# Patient Record
Sex: Male | Born: 1955 | Hispanic: No | Marital: Married | Smoking: Former smoker
Health system: Southern US, Community
[De-identification: ages and names within clinical notes are randomized; demographics above are authoritative.]

## PROBLEM LIST (undated history)

## (undated) DIAGNOSIS — E119 Type 2 diabetes mellitus without complications: Secondary | ICD-10-CM

---

## 1998-11-22 ENCOUNTER — Encounter: Payer: Self-pay | Admitting: Emergency Medicine

## 1998-11-22 ENCOUNTER — Emergency Department (HOSPITAL_COMMUNITY): Admission: EM | Admit: 1998-11-22 | Discharge: 1998-11-22 | Payer: Self-pay | Admitting: Emergency Medicine

## 2001-05-24 ENCOUNTER — Emergency Department (HOSPITAL_COMMUNITY): Admission: EM | Admit: 2001-05-24 | Discharge: 2001-05-24 | Payer: Self-pay | Admitting: Emergency Medicine

## 2001-05-24 ENCOUNTER — Encounter: Payer: Self-pay | Admitting: Emergency Medicine

## 2001-05-28 ENCOUNTER — Ambulatory Visit (HOSPITAL_COMMUNITY): Admission: RE | Admit: 2001-05-28 | Discharge: 2001-05-28 | Payer: Self-pay | Admitting: Urology

## 2001-05-28 ENCOUNTER — Encounter: Payer: Self-pay | Admitting: Urology

## 2004-01-27 ENCOUNTER — Emergency Department (HOSPITAL_COMMUNITY): Admission: EM | Admit: 2004-01-27 | Discharge: 2004-01-27 | Payer: Self-pay | Admitting: Emergency Medicine

## 2004-07-07 IMAGING — CR DG ABDOMEN ACUTE W/ 1V CHEST
3 series · 3 of 3 positions shown · non-contrast
Comparison: none

CLINICAL DATA: Lt abdominal pain.
 ABDOMEN ACUTE WITH PA CHEST
 Comparison chest radiograph 05/24/01.  
 Normal heart size, mediastinal contours, and vascularity.  Lungs clear. 
 Left renal calculi including tiny calculus 2-3 mm diameter at lower pole left kidney and a calculus at the left renal pelvis measuring 11 x 7 mm diameter.  An additional calcification is noted in the left pelvis, angular, 6 x 5 mm in diameter, suspicious for distal ureteral calculus.  No right side calculi identified.  Bowel gas pattern unremarkable with stool noted in the proximal half of the right colon.  No bony lesions.  No free intraperitoneal air.
 IMPRESSION
 No acute pulmonary abnormalities.
 Left renal calculi with calculi noted at the left renal pelvis and distal left ureter.

[view not recorded (1 of 3)]
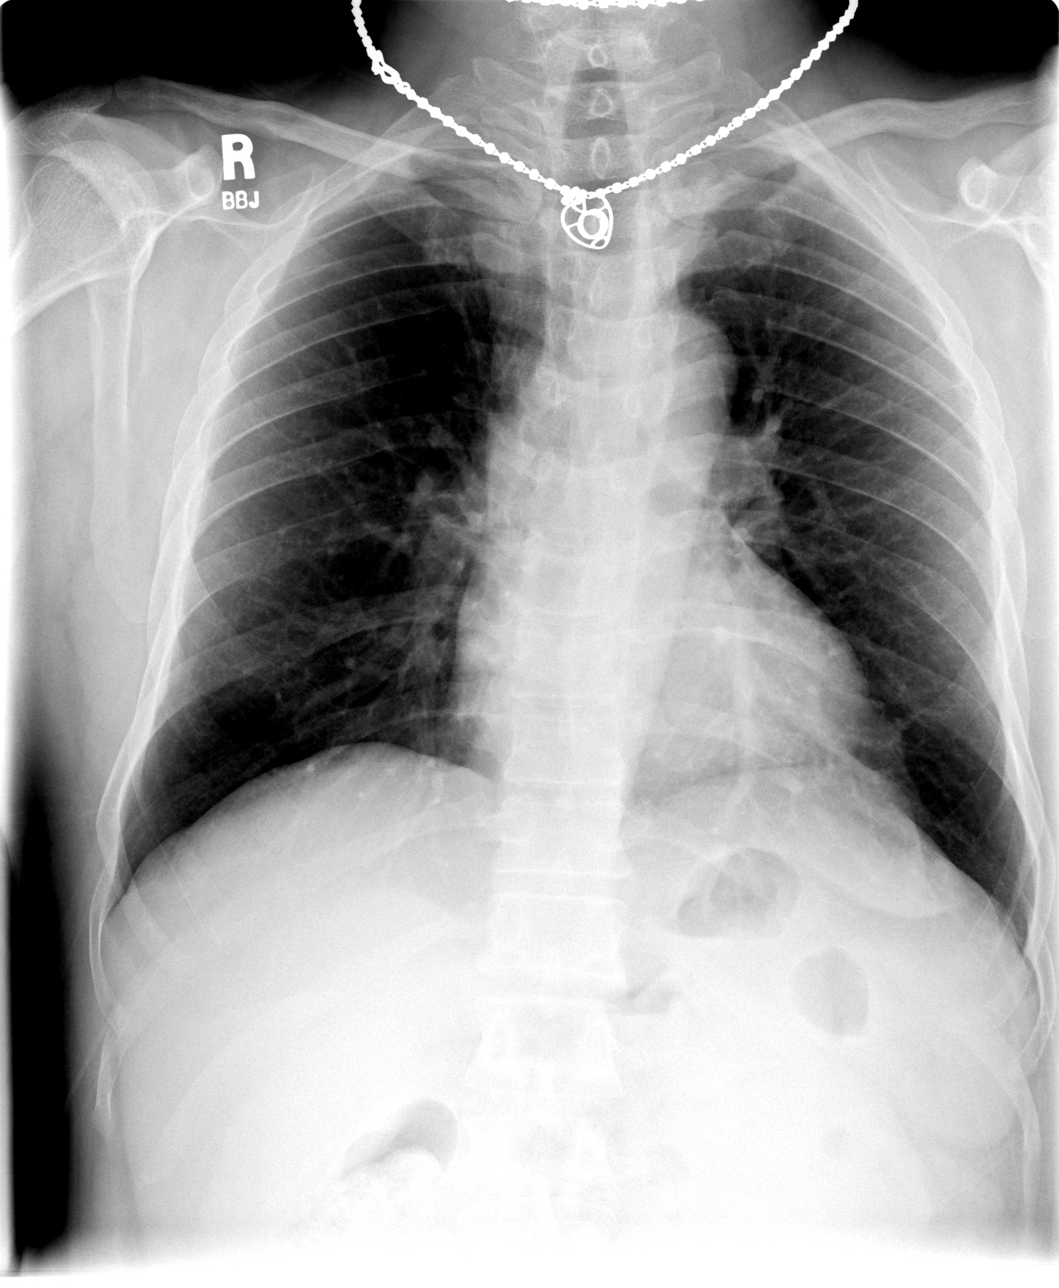

[view not recorded (2 of 3)]
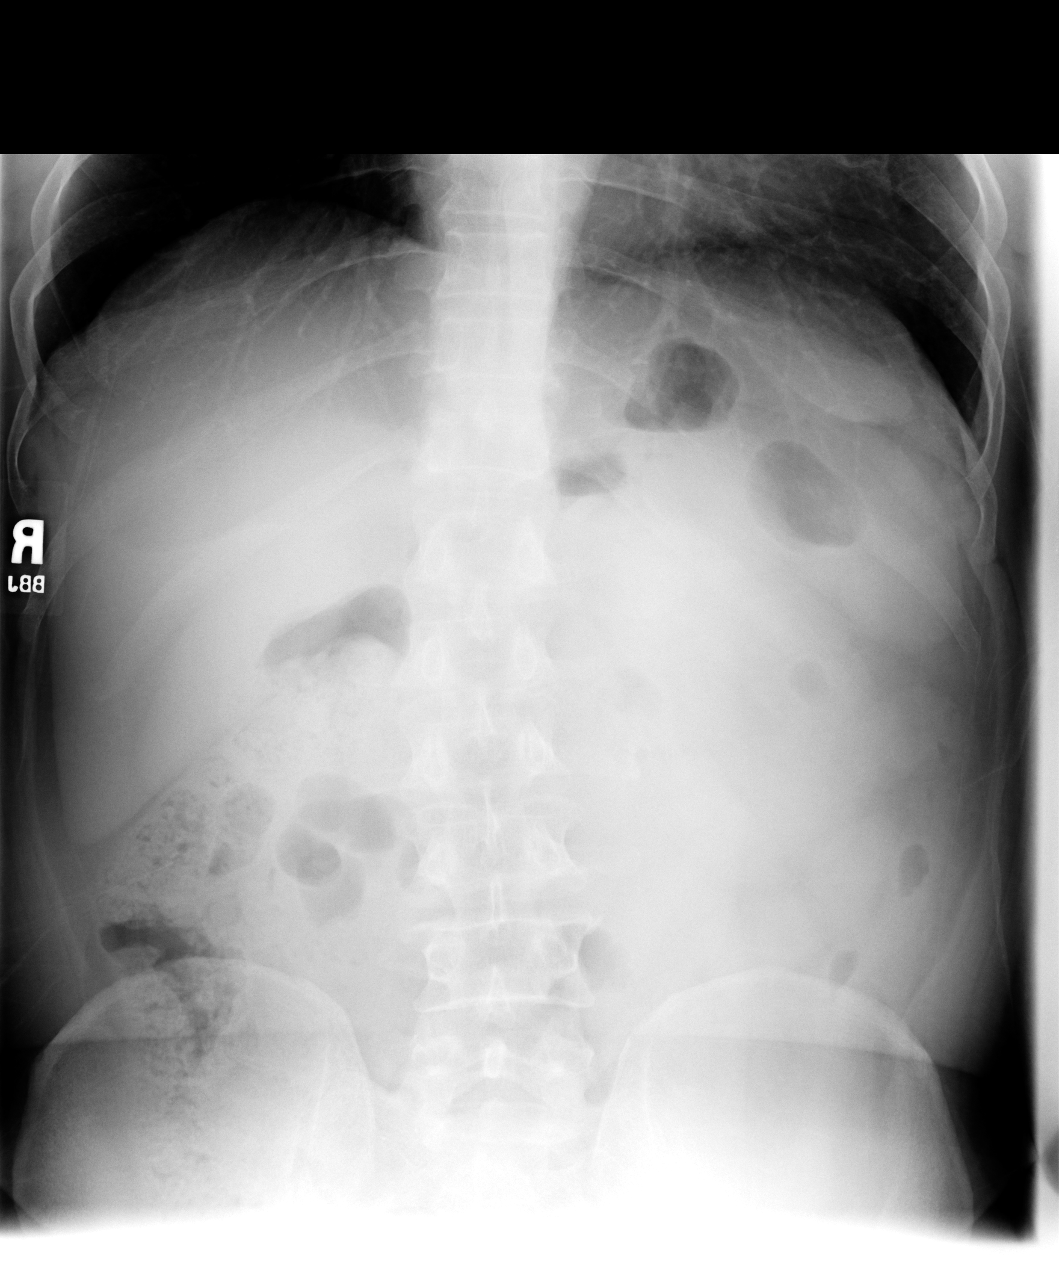

[view not recorded (3 of 3)]
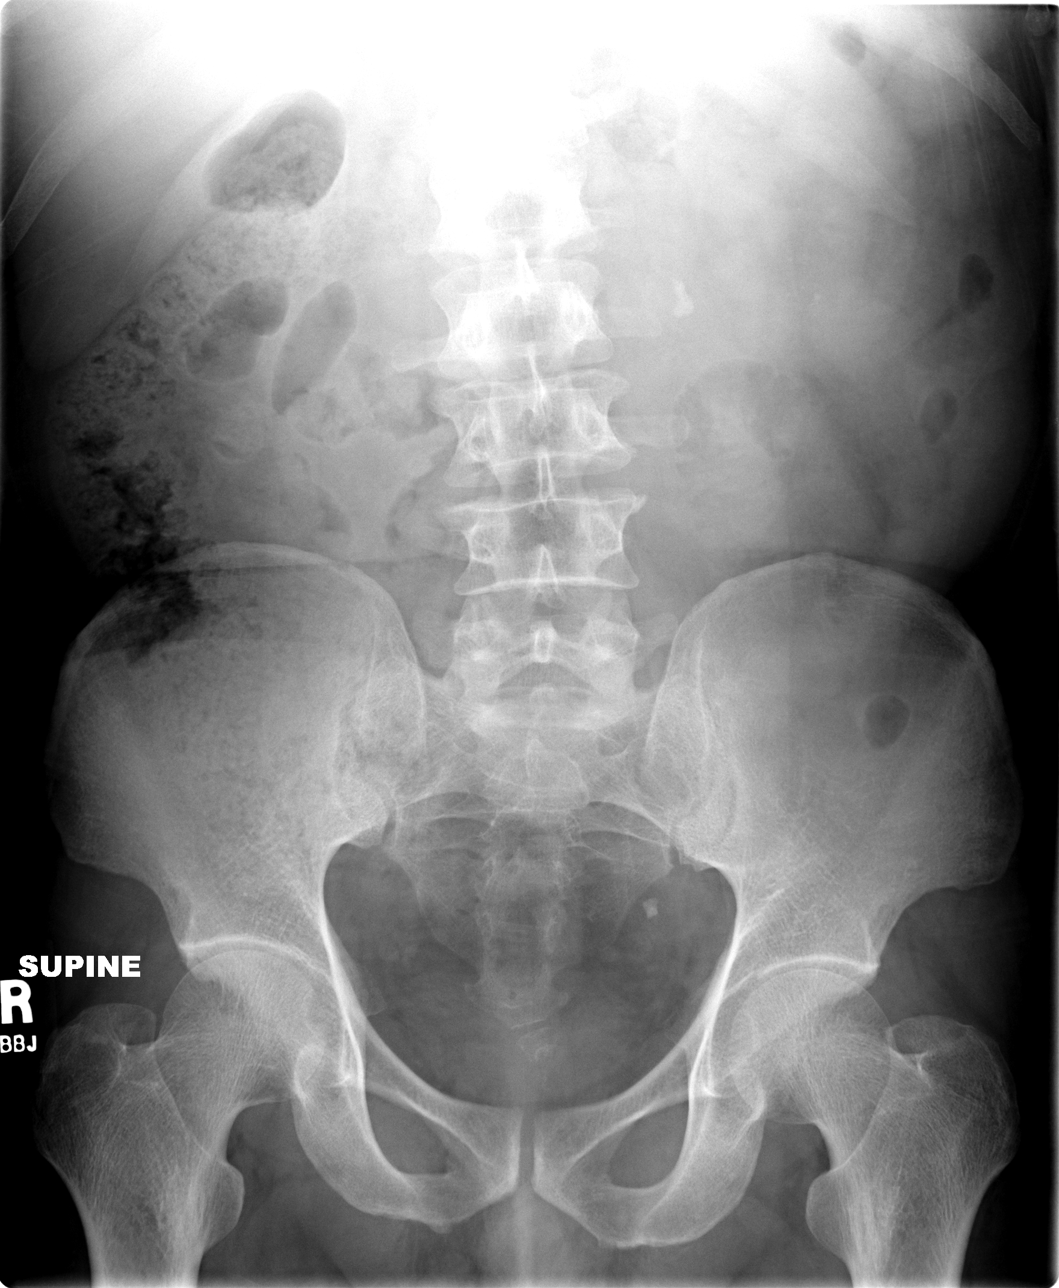

[3 of 3 positions shown; findings below may reference images not displayed]

## 2014-03-31 ENCOUNTER — Other Ambulatory Visit (HOSPITAL_COMMUNITY): Payer: Self-pay | Admitting: Urology

## 2014-03-31 DIAGNOSIS — N509 Disorder of male genital organs, unspecified: Secondary | ICD-10-CM

## 2014-04-06 ENCOUNTER — Ambulatory Visit (HOSPITAL_COMMUNITY): Payer: Self-pay | Attending: Urology

## 2015-04-30 DIAGNOSIS — F17209 Nicotine dependence, unspecified, with unspecified nicotine-induced disorders: Secondary | ICD-10-CM | POA: Insufficient documentation

## 2015-05-13 DIAGNOSIS — N4400 Torsion of testis, unspecified: Secondary | ICD-10-CM | POA: Insufficient documentation

## 2015-05-13 DIAGNOSIS — Z9079 Acquired absence of other genital organ(s): Secondary | ICD-10-CM | POA: Insufficient documentation

## 2020-03-22 DIAGNOSIS — L409 Psoriasis, unspecified: Secondary | ICD-10-CM | POA: Diagnosis not present

## 2020-03-22 DIAGNOSIS — M1990 Unspecified osteoarthritis, unspecified site: Secondary | ICD-10-CM | POA: Diagnosis not present

## 2020-03-22 DIAGNOSIS — Z6824 Body mass index (BMI) 24.0-24.9, adult: Secondary | ICD-10-CM | POA: Diagnosis not present

## 2020-03-22 DIAGNOSIS — E7800 Pure hypercholesterolemia, unspecified: Secondary | ICD-10-CM | POA: Diagnosis not present

## 2020-03-22 DIAGNOSIS — Z299 Encounter for prophylactic measures, unspecified: Secondary | ICD-10-CM | POA: Diagnosis not present

## 2020-03-22 DIAGNOSIS — Z87891 Personal history of nicotine dependence: Secondary | ICD-10-CM | POA: Diagnosis not present

## 2020-08-02 DIAGNOSIS — L400 Psoriasis vulgaris: Secondary | ICD-10-CM | POA: Diagnosis not present

## 2020-08-02 DIAGNOSIS — Z79899 Other long term (current) drug therapy: Secondary | ICD-10-CM | POA: Diagnosis not present

## 2020-08-09 DIAGNOSIS — R5383 Other fatigue: Secondary | ICD-10-CM | POA: Diagnosis not present

## 2020-08-09 DIAGNOSIS — E7800 Pure hypercholesterolemia, unspecified: Secondary | ICD-10-CM | POA: Diagnosis not present

## 2020-08-09 DIAGNOSIS — Z0000 Encounter for general adult medical examination without abnormal findings: Secondary | ICD-10-CM | POA: Diagnosis not present

## 2020-08-09 DIAGNOSIS — Z7189 Other specified counseling: Secondary | ICD-10-CM | POA: Diagnosis not present

## 2020-08-09 DIAGNOSIS — Z299 Encounter for prophylactic measures, unspecified: Secondary | ICD-10-CM | POA: Diagnosis not present

## 2020-08-09 DIAGNOSIS — Z6823 Body mass index (BMI) 23.0-23.9, adult: Secondary | ICD-10-CM | POA: Diagnosis not present

## 2020-09-27 DIAGNOSIS — Z6823 Body mass index (BMI) 23.0-23.9, adult: Secondary | ICD-10-CM | POA: Diagnosis not present

## 2020-09-27 DIAGNOSIS — Z418 Encounter for other procedures for purposes other than remedying health state: Secondary | ICD-10-CM | POA: Diagnosis not present

## 2020-09-27 DIAGNOSIS — E1165 Type 2 diabetes mellitus with hyperglycemia: Secondary | ICD-10-CM | POA: Diagnosis not present

## 2020-09-27 DIAGNOSIS — M1990 Unspecified osteoarthritis, unspecified site: Secondary | ICD-10-CM | POA: Diagnosis not present

## 2020-09-27 DIAGNOSIS — E7800 Pure hypercholesterolemia, unspecified: Secondary | ICD-10-CM | POA: Diagnosis not present

## 2020-09-27 DIAGNOSIS — Z23 Encounter for immunization: Secondary | ICD-10-CM | POA: Diagnosis not present

## 2020-10-10 DIAGNOSIS — M1990 Unspecified osteoarthritis, unspecified site: Secondary | ICD-10-CM | POA: Diagnosis not present

## 2020-10-10 DIAGNOSIS — E1165 Type 2 diabetes mellitus with hyperglycemia: Secondary | ICD-10-CM | POA: Diagnosis not present

## 2020-11-29 ENCOUNTER — Inpatient Hospital Stay (HOSPITAL_COMMUNITY)
Admission: AD | Admit: 2020-11-29 | Discharge: 2020-12-01 | DRG: 247 | Disposition: A | Discharge status: Home / Self Care | Payer: Medicare Other | Attending: Interventional Cardiology | Admitting: Interventional Cardiology

## 2020-11-29 ENCOUNTER — Other Ambulatory Visit: Payer: Self-pay

## 2020-11-29 ENCOUNTER — Encounter (HOSPITAL_COMMUNITY): Payer: Self-pay | Admitting: Interventional Cardiology

## 2020-11-29 ENCOUNTER — Encounter (HOSPITAL_COMMUNITY)
Admission: AD | Disposition: A | Discharge status: Home / Self Care | Payer: Self-pay | Source: Home / Self Care | Attending: Interventional Cardiology

## 2020-11-29 DIAGNOSIS — Z743 Need for continuous supervision: Secondary | ICD-10-CM | POA: Diagnosis not present

## 2020-11-29 DIAGNOSIS — L400 Psoriasis vulgaris: Secondary | ICD-10-CM | POA: Diagnosis present

## 2020-11-29 DIAGNOSIS — I2510 Atherosclerotic heart disease of native coronary artery without angina pectoris: Secondary | ICD-10-CM | POA: Diagnosis present

## 2020-11-29 DIAGNOSIS — Z720 Tobacco use: Secondary | ICD-10-CM | POA: Diagnosis not present

## 2020-11-29 DIAGNOSIS — Z20822 Contact with and (suspected) exposure to covid-19: Secondary | ICD-10-CM | POA: Diagnosis present

## 2020-11-29 DIAGNOSIS — E782 Mixed hyperlipidemia: Secondary | ICD-10-CM | POA: Diagnosis present

## 2020-11-29 DIAGNOSIS — Z9079 Acquired absence of other genital organ(s): Secondary | ICD-10-CM

## 2020-11-29 DIAGNOSIS — L409 Psoriasis, unspecified: Secondary | ICD-10-CM

## 2020-11-29 DIAGNOSIS — I10 Essential (primary) hypertension: Secondary | ICD-10-CM

## 2020-11-29 DIAGNOSIS — I2119 ST elevation (STEMI) myocardial infarction involving other coronary artery of inferior wall: Principal | ICD-10-CM | POA: Diagnosis present

## 2020-11-29 DIAGNOSIS — I213 ST elevation (STEMI) myocardial infarction of unspecified site: Secondary | ICD-10-CM | POA: Diagnosis not present

## 2020-11-29 DIAGNOSIS — Z955 Presence of coronary angioplasty implant and graft: Secondary | ICD-10-CM

## 2020-11-29 DIAGNOSIS — R6889 Other general symptoms and signs: Secondary | ICD-10-CM | POA: Diagnosis not present

## 2020-11-29 DIAGNOSIS — Z87442 Personal history of urinary calculi: Secondary | ICD-10-CM

## 2020-11-29 DIAGNOSIS — R079 Chest pain, unspecified: Secondary | ICD-10-CM | POA: Diagnosis not present

## 2020-11-29 DIAGNOSIS — I5022 Chronic systolic (congestive) heart failure: Secondary | ICD-10-CM | POA: Diagnosis not present

## 2020-11-29 DIAGNOSIS — E785 Hyperlipidemia, unspecified: Secondary | ICD-10-CM

## 2020-11-29 DIAGNOSIS — I499 Cardiac arrhythmia, unspecified: Secondary | ICD-10-CM | POA: Diagnosis not present

## 2020-11-29 DIAGNOSIS — I110 Hypertensive heart disease with heart failure: Secondary | ICD-10-CM | POA: Diagnosis present

## 2020-11-29 DIAGNOSIS — Z87891 Personal history of nicotine dependence: Secondary | ICD-10-CM | POA: Diagnosis not present

## 2020-11-29 DIAGNOSIS — I2121 ST elevation (STEMI) myocardial infarction involving left circumflex coronary artery: Secondary | ICD-10-CM | POA: Diagnosis not present

## 2020-11-29 DIAGNOSIS — E118 Type 2 diabetes mellitus with unspecified complications: Secondary | ICD-10-CM

## 2020-11-29 DIAGNOSIS — Z794 Long term (current) use of insulin: Secondary | ICD-10-CM

## 2020-11-29 HISTORY — PX: LEFT HEART CATH AND CORONARY ANGIOGRAPHY: CATH118249

## 2020-11-29 LAB — POCT I-STAT, CHEM 8
BUN: 11 mg/dL (ref 8–23)
Calcium, Ion: 1.12 mmol/L — ABNORMAL LOW (ref 1.15–1.40)
Chloride: 94 mmol/L — ABNORMAL LOW (ref 98–111)
Creatinine, Ser: 0.5 mg/dL — ABNORMAL LOW (ref 0.61–1.24)
Glucose, Bld: 182 mg/dL — ABNORMAL HIGH (ref 70–99)
HCT: 39 % (ref 39.0–52.0)
Hemoglobin: 13.3 g/dL (ref 13.0–17.0)
Potassium: 3.5 mmol/L (ref 3.5–5.1)
Sodium: 130 mmol/L — ABNORMAL LOW (ref 135–145)
TCO2: 20 mmol/L — ABNORMAL LOW (ref 22–32)

## 2020-11-29 LAB — LIPID PANEL
Cholesterol: 127 mg/dL (ref 0–200)
Cholesterol: 118 mg/dL (ref 0–200)
HDL: 24 mg/dL — ABNORMAL LOW (ref >40)
HDL: 21 mg/dL — ABNORMAL LOW (ref >40)
LDL Cholesterol: 79 mg/dL (ref 0–99)
LDL Cholesterol: 56 mg/dL (ref 0–99)
Total CHOL/HDL Ratio: 5.3 RATIO
Total CHOL/HDL Ratio: 5.6 RATIO
Triglycerides: 119 mg/dL (ref <150)
Triglycerides: 206 mg/dL — ABNORMAL HIGH (ref <150)
VLDL: 24 mg/dL (ref 0–40)
VLDL: 41 mg/dL — ABNORMAL HIGH (ref 0–40)

## 2020-11-29 LAB — CBC
HCT: 37.9 % — ABNORMAL LOW (ref 39.0–52.0)
HCT: 37.9 % — ABNORMAL LOW (ref 39.0–52.0)
Hemoglobin: 13.7 g/dL (ref 13.0–17.0)
Hemoglobin: 13 g/dL (ref 13.0–17.0)
MCH: 33.9 pg (ref 26.0–34.0)
MCH: 33.1 pg (ref 26.0–34.0)
MCHC: 36.1 g/dL — ABNORMAL HIGH (ref 30.0–36.0)
MCHC: 34.3 g/dL (ref 30.0–36.0)
MCV: 93.8 fL (ref 80.0–100.0)
MCV: 96.4 fL (ref 80.0–100.0)
Platelets: 218 10*3/uL (ref 150–400)
Platelets: 207 10*3/uL (ref 150–400)
RBC: 4.04 MIL/uL — ABNORMAL LOW (ref 4.22–5.81)
RBC: 3.93 MIL/uL — ABNORMAL LOW (ref 4.22–5.81)
RDW: 15.1 % (ref 11.5–15.5)
RDW: 15.1 % (ref 11.5–15.5)
WBC: 6 10*3/uL (ref 4.0–10.5)
WBC: 9.1 10*3/uL (ref 4.0–10.5)
nRBC: 0 % (ref 0.0–0.2)
nRBC: 0 % (ref 0.0–0.2)

## 2020-11-29 LAB — COMPREHENSIVE METABOLIC PANEL
ALT: 45 U/L — ABNORMAL HIGH (ref 0–44)
AST: 29 U/L (ref 15–41)
Albumin: 3.7 g/dL (ref 3.5–5.0)
Alkaline Phosphatase: 83 U/L (ref 38–126)
Anion gap: 11 (ref 5–15)
BUN: 11 mg/dL (ref 8–23)
CO2: 19 mmol/L — ABNORMAL LOW (ref 22–32)
Calcium: 8.7 mg/dL — ABNORMAL LOW (ref 8.9–10.3)
Chloride: 105 mmol/L (ref 98–111)
Creatinine, Ser: 0.82 mg/dL (ref 0.61–1.24)
GFR, Estimated: >60 mL/min (ref >60)
Glucose, Bld: 222 mg/dL — ABNORMAL HIGH (ref 70–99)
Potassium: 3.9 mmol/L (ref 3.5–5.1)
Sodium: 135 mmol/L (ref 135–145)
Total Bilirubin: 0.7 mg/dL (ref 0.3–1.2)
Total Protein: 6.8 g/dL (ref 6.5–8.1)

## 2020-11-29 LAB — HEMOGLOBIN A1C
Hgb A1c MFr Bld: 7.8 % — ABNORMAL HIGH (ref 4.8–5.6)
Mean Plasma Glucose: 177.16 mg/dL

## 2020-11-29 LAB — RESP PANEL BY RT-PCR (FLU A&B, COVID) ARPGX2
Influenza A by PCR: NEGATIVE
Influenza B by PCR: NEGATIVE
SARS Coronavirus 2 by RT PCR: NEGATIVE

## 2020-11-29 LAB — APTT: aPTT: 31 seconds (ref 24–36)

## 2020-11-29 LAB — PROTIME-INR
INR: 1.2 (ref 0.8–1.2)
Prothrombin Time: 14.7 seconds (ref 11.4–15.2)

## 2020-11-29 LAB — TSH: TSH: 0.924 u[IU]/mL (ref 0.350–4.500)

## 2020-11-29 LAB — CREATININE, SERUM
Creatinine, Ser: 0.72 mg/dL (ref 0.61–1.24)
GFR, Estimated: >60 mL/min (ref >60)

## 2020-11-29 LAB — BRAIN NATRIURETIC PEPTIDE: B Natriuretic Peptide: 65.6 pg/mL (ref 0.0–100.0)

## 2020-11-29 LAB — POCT ACTIVATED CLOTTING TIME: Activated Clotting Time: 345 seconds

## 2020-11-29 LAB — TROPONIN I (HIGH SENSITIVITY): Troponin I (High Sensitivity): 18 ng/L — ABNORMAL HIGH (ref <18)

## 2020-11-29 LAB — GLUCOSE, CAPILLARY: Glucose-Capillary: 179 mg/dL — ABNORMAL HIGH (ref 70–99)

## 2020-11-29 SURGERY — LEFT HEART CATH AND CORONARY ANGIOGRAPHY
Anesthesia: LOCAL

## 2020-11-29 MED ORDER — LIDOCAINE HCL (PF) 1 % IJ SOLN
INTRAMUSCULAR | Status: AC
  Filled 2020-11-29: qty 30

## 2020-11-29 MED ORDER — SODIUM CHLORIDE 0.9 % IV SOLN: INTRAVENOUS | Status: AC

## 2020-11-29 MED ORDER — MIDAZOLAM HCL 2 MG/2ML IJ SOLN
INTRAMUSCULAR | Status: DC | PRN
  Administered 2020-11-29: 1 mg via INTRAVENOUS

## 2020-11-29 MED ORDER — MIDAZOLAM HCL 2 MG/2ML IJ SOLN
INTRAMUSCULAR | Status: AC
  Filled 2020-11-29: qty 2

## 2020-11-29 MED ORDER — SODIUM CHLORIDE 0.9 % IV SOLN
INTRAVENOUS | Status: AC | PRN
  Administered 2020-11-29: 10 mL/h via INTRAVENOUS

## 2020-11-29 MED ORDER — TIROFIBAN HCL IN NACL 5-0.9 MG/100ML-% IV SOLN: 0.1500 ug/kg/min | INTRAVENOUS | Status: AC

## 2020-11-29 MED ORDER — LABETALOL HCL 5 MG/ML IV SOLN: 10.0000 mg | INTRAVENOUS | Status: AC | PRN

## 2020-11-29 MED ORDER — ONDANSETRON HCL 4 MG/2ML IJ SOLN: 4.0000 mg | Freq: Four times a day (QID) | INTRAMUSCULAR | Status: DC | PRN

## 2020-11-29 MED ORDER — ACETAMINOPHEN 325 MG PO TABS
650.0000 mg | ORAL_TABLET | ORAL | Status: DC | PRN
  Filled 2020-11-29: qty 2

## 2020-11-29 MED ORDER — TICAGRELOR 90 MG PO TABS
ORAL_TABLET | ORAL | Status: DC | PRN
  Administered 2020-11-29: 180 mg via ORAL

## 2020-11-29 MED ORDER — VERAPAMIL HCL 2.5 MG/ML IV SOLN
INTRAVENOUS | Status: AC
  Filled 2020-11-29: qty 2

## 2020-11-29 MED ORDER — TIROFIBAN HCL IN NACL 5-0.9 MG/100ML-% IV SOLN
INTRAVENOUS | Status: AC
  Filled 2020-11-29: qty 100

## 2020-11-29 MED ORDER — HEPARIN SODIUM (PORCINE) 1000 UNIT/ML IJ SOLN
INTRAMUSCULAR | Status: DC | PRN
  Administered 2020-11-29: 7000 [IU] via INTRAVENOUS

## 2020-11-29 MED ORDER — HYDRALAZINE HCL 20 MG/ML IJ SOLN: 10.0000 mg | INTRAMUSCULAR | Status: AC | PRN

## 2020-11-29 MED ORDER — IOHEXOL 350 MG/ML SOLN
INTRAVENOUS | Status: AC
  Filled 2020-11-29: qty 1

## 2020-11-29 MED ORDER — OXYCODONE HCL 5 MG PO TABS: 5.0000 mg | ORAL_TABLET | ORAL | Status: DC | PRN

## 2020-11-29 MED ORDER — HEPARIN (PORCINE) IN NACL 1000-0.9 UT/500ML-% IV SOLN
INTRAVENOUS | Status: DC | PRN
  Administered 2020-11-29 (×2): 500 mL

## 2020-11-29 MED ORDER — ATORVASTATIN CALCIUM 80 MG PO TABS
80.0000 mg | ORAL_TABLET | Freq: Every day | ORAL | Status: DC
  Administered 2020-11-29 – 2020-11-30 (×2): 80 mg via ORAL
  Filled 2020-11-29 (×3): qty 1

## 2020-11-29 MED ORDER — IOHEXOL 350 MG/ML SOLN
INTRAVENOUS | Status: DC | PRN
  Administered 2020-11-29: 15:00:00 85 mL

## 2020-11-29 MED ORDER — HEPARIN (PORCINE) IN NACL 1000-0.9 UT/500ML-% IV SOLN
INTRAVENOUS | Status: AC
  Filled 2020-11-29: qty 500

## 2020-11-29 MED ORDER — LIDOCAINE HCL (PF) 1 % IJ SOLN
INTRAMUSCULAR | Status: DC | PRN
  Administered 2020-11-29: 2 mL

## 2020-11-29 MED ORDER — SODIUM CHLORIDE 0.9% FLUSH: 3.0000 mL | INTRAVENOUS | Status: DC | PRN

## 2020-11-29 MED ORDER — FENTANYL CITRATE (PF) 100 MCG/2ML IJ SOLN
INTRAMUSCULAR | Status: AC
  Filled 2020-11-29: qty 2

## 2020-11-29 MED ORDER — TICAGRELOR 90 MG PO TABS
90.0000 mg | ORAL_TABLET | Freq: Two times a day (BID) | ORAL | Status: DC
  Administered 2020-11-29 – 2020-12-01 (×4): 90 mg via ORAL
  Filled 2020-11-29 (×4): qty 1

## 2020-11-29 MED ORDER — FENTANYL CITRATE (PF) 100 MCG/2ML IJ SOLN
INTRAMUSCULAR | Status: DC | PRN
  Administered 2020-11-29: 50 ug via INTRAVENOUS

## 2020-11-29 MED ORDER — SODIUM CHLORIDE 0.9 % IV SOLN: 250.0000 mL | INTRAVENOUS | Status: DC | PRN

## 2020-11-29 MED ORDER — ASPIRIN 81 MG PO CHEW
81.0000 mg | CHEWABLE_TABLET | Freq: Every day | ORAL | Status: DC
  Administered 2020-11-30 – 2020-12-01 (×2): 81 mg via ORAL
  Filled 2020-11-29 (×2): qty 1

## 2020-11-29 MED ORDER — METOPROLOL TARTRATE 12.5 MG HALF TABLET
12.5000 mg | ORAL_TABLET | Freq: Two times a day (BID) | ORAL | Status: DC
  Administered 2020-11-29: 21:00:00 12.5 mg via ORAL
  Filled 2020-11-29: qty 1

## 2020-11-29 MED ORDER — HEPARIN SODIUM (PORCINE) 5000 UNIT/ML IJ SOLN
5000.0000 [IU] | Freq: Three times a day (TID) | INTRAMUSCULAR | Status: DC
  Administered 2020-11-29 – 2020-12-01 (×5): 5000 [IU] via SUBCUTANEOUS
  Filled 2020-11-29 (×5): qty 1

## 2020-11-29 MED ORDER — SODIUM CHLORIDE 0.9% FLUSH
3.0000 mL | Freq: Two times a day (BID) | INTRAVENOUS | Status: DC
  Administered 2020-11-29 – 2020-11-30 (×3): 3 mL via INTRAVENOUS

## 2020-11-29 MED ORDER — NITROGLYCERIN 1 MG/10 ML FOR IR/CATH LAB
INTRA_ARTERIAL | Status: DC | PRN
  Administered 2020-11-29: 100 ug via INTRACORONARY

## 2020-11-29 MED ORDER — TIROFIBAN (AGGRASTAT) BOLUS VIA INFUSION
INTRAVENOUS | Status: DC | PRN
  Administered 2020-11-29: 15:00:00 1565 ug via INTRAVENOUS

## 2020-11-29 MED ORDER — VERAPAMIL HCL 2.5 MG/ML IV SOLN: INTRAVENOUS | Status: DC | PRN

## 2020-11-29 MED ORDER — TIROFIBAN HCL IN NACL 5-0.9 MG/100ML-% IV SOLN
INTRAVENOUS | Status: AC | PRN
  Administered 2020-11-29: 0.15 ug/kg/min via INTRAVENOUS

## 2020-11-29 SURGICAL SUPPLY — 21 items
BALLN SAPPHIRE 3.0X12 (BALLOONS) ×2
BALLN ~~LOC~~ EMERGE MR 3.5X12 (BALLOONS) ×2
BALLOON SAPPHIRE 3.0X12 (BALLOONS) IMPLANT
BALLOON ~~LOC~~ EMERGE MR 3.5X12 (BALLOONS) IMPLANT
CATH 5FR JL3.5 JR4 ANG PIG MP (CATHETERS) ×1 IMPLANT
CATH LAUNCHER 6FR EBU3.5 (CATHETERS) ×1 IMPLANT
CATH OPTICROSS HD (CATHETERS) ×1 IMPLANT
CATH VISTA GUIDE 6FR XBLAD3.5 (CATHETERS) ×1 IMPLANT
DEVICE RAD COMP TR BAND LRG (VASCULAR PRODUCTS) ×1 IMPLANT
GLIDESHEATH SLEND A-KIT 6F 22G (SHEATH) ×1 IMPLANT
GUIDEWIRE INQWIRE 1.5J.035X260 (WIRE) IMPLANT
INQWIRE 1.5J .035X260CM (WIRE) ×2
KIT ENCORE 26 ADVANTAGE (KITS) ×1 IMPLANT
KIT HEART LEFT (KITS) ×2 IMPLANT
PACK CARDIAC CATHETERIZATION (CUSTOM PROCEDURE TRAY) ×2 IMPLANT
SHEATH PROBE COVER 6X72 (BAG) ×1 IMPLANT
SLED PULL BACK IVUS (MISCELLANEOUS) ×1 IMPLANT
STENT RESOLUTE ONYX 3.0X18 (Permanent Stent) ×1 IMPLANT
TRANSDUCER W/STOPCOCK (MISCELLANEOUS) ×2 IMPLANT
TUBING CIL FLEX 10 FLL-RA (TUBING) ×2 IMPLANT
WIRE ASAHI PROWATER 180CM (WIRE) ×1 IMPLANT

## 2020-11-29 NOTE — Progress Notes (Signed)
Patient went to cath lab. Waiting on wife to arrive. Will follow as needed.  Austin Tucker, [place], [facility], Pager [phone]

## 2020-11-29 NOTE — H&P (Signed)
Cardiology Admission History and Physical:   Patient ID: [name] MRN: [id number]; DOB: 1956-06-04   Admission date: 11/29/2020  Primary Care Provider: Pcp, No [facility] Cardiologist: No primary care provider on file.  Emelda Brothers, MD [facility] Electrophysiologist:    Chief Complaint: Inferior lateral ST elevation MI  Patient Profile:   Austin Tucker is a 64 y.o. male with history of psoriasis, diabetes mellitus to, and prior smoker who developed chest discomfort shortly after lunch at 12 noon and was found by EMS to have ST elevation and STEMI activation ensued after transmission with STE in 2,3, avF, and V5-6 with reciprocal ST depression in V1 through V3.  History of Present Illness:   Austin Tucker 64 year old gentleman with longstanding history of psoriasis.  Former cigarette smoker.  No prior history of heart disease.  Recently diagnosed with type 2 diabetes.  Suddenly developed chest discomfort shortly after eating lunch between noon time and 12:30 PM.  He contacted EMS who transmitted an EKG at around 2 PM that demonstrated evidence of inferolateral acute ST elevation MI.  Upon arrival in the [facility] emergency room at approximately 2:30 PM he was quickly interviewed, examined, and taken straight to the [facility] catheterization laboratory.   PAST MEDICAL HISTORY: 1. Amputation of right hand finger 2. 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 3. Severe psoriasis and currently on methotrexate once per week 4. Nephrolithiasis 5. Diabetes mellitus type 2  Medications Prior to Admission: Prior to Admission medications   Not on File     Allergies:   Not on File  Social History:   Social History   Socioeconomic History  . Marital status: Married    Spouse name: Not on file  . Number of children: Not on file  . Years of education: Not on file  . Highest education level: Not on file  Occupational History  . Not on file  Tobacco Use  .  Smoking status: Not on file  . Smokeless tobacco: Not on file  Substance and Sexual Activity  . Alcohol use: Not on file  . Drug use: Not on file  . Sexual activity: Not on file  Other Topics Concern  . Not on file  Social History Narrative  . Not on file   Social Determinants of Health   Financial Resource Strain: Not on file  Food Insecurity: Not on file  Transportation Needs: Not on file  Physical Activity: Not on file  Stress: Not on file  Social Connections: Not on file  Intimate Partner Violence: Not on file    Family History: No family history of CAD The patient's family history is not on file.    ROS:  Please see the history of present illness.  No history of kidney disease, gastrointestinal bleeding, or stroke.  Does have amputation of a right hand finger.  Orchiectomy because of epididymitis.  All other ROS reviewed and negative.     Physical Exam/Data:   Vitals:   11/29/20 1518 11/29/20 1523 11/29/20 1528 11/29/20 1540  BP: 118/77 116/79 117/75 122/71  Pulse: 80 81 77 77  Resp: (!) 120 (!) 24 (!) 46 (!) 21  SpO2: 99% 100% 97% 97%  Weight:      Height:       No intake or output data in the 24 hours ending 11/29/20 1557 Last 3 Weights 11/29/2020  Weight (lbs) 138 lb  Weight (kg) 62.596 kg     Body mass index is 25.24 kg/m.  General:  Well nourished, well developed, in no acute distress.  Brown skinned of [place] descent. HEENT: normal Lymph: no adenopathy Neck: no JVD Endocrine:  No thryomegaly Vascular: No carotid bruits; FA pulses 2+ bilaterally without bruits.  Radial, carotid, and posterior tibial pulses are 2+ and symmetric throughout. Cardiac:  normal S1, S2; RRR; no murmur  Lungs:  clear to auscultation bilaterally, no wheezing, rhonchi or rales  Abd: soft, nontender, no hepatomegaly  Ext: no edema Musculoskeletal:  No deformities, BUE and BLE strength normal and equal Skin: warm and dry  Neuro:  CNs 2-12 intact, no focal abnormalities  noted Psych:  Normal affect    EKG:  The ECG that was done at 2:07 PM by emergency medical staff at the patient's home was personally reviewed and demonstrates evidence of inferolateral ST elevation with reciprocal precordial ST segment depression consistent with STEMI.  Relevant CV Studies: No prior cardiac studies.  Laboratory Data:  High Sensitivity Troponin:  No results for input(s): TROPONINIHS in the last 720 hours.    ChemistryNo results for input(s): NA, K, CL, CO2, GLUCOSE, BUN, CREATININE, CALCIUM, GFRNONAA, GFRAA, ANIONGAP in the last 168 hours.  No results for input(s): PROT, ALBUMIN, AST, ALT, ALKPHOS, BILITOT in the last 168 hours. Hematology Recent Labs  Lab 11/29/20 1448  WBC 6.0  RBC 4.04*  HGB 13.7  HCT 37.9*  MCV 93.8  MCH 33.9  MCHC 36.1*  RDW 15.1  PLT 218   BNPNo results for input(s): BNP, PROBNP in the last 168 hours.  DDimer No results for input(s): DDIMER in the last 168 hours.   Radiology/Studies:  No results found.   Assessment and Plan:   1. Acute inferolateral ST elevation MI 2. Psoriasis 3. Tobacco use 4. Lipid status unknown 5. Diabetes mellitus type 2.  PLAN:  1. Emergency coronary angiography with mechanical reperfusion which could include stent implantation if indicated.  This was discussed with the patient under emergency circumstances.  The procedure including radial access and risks of stroke, death, myocardial infarction extension, emergency surgery, kidney failure, and bleeding were discussed and accepted by the patient. 2. Continue same therapy.  Is on methotrexate.  He has been vaccinated for COVID-19. 3. Prior smoker and possibly still smokes an occasional cigarette 4. Lipids will be obtained as well as a hemoglobin A1c.  He apparently has been recently diagnosed as either prediabetic or diabetic. 5. Maybe we should consider SGLT2 therapy as this may give additional protection against future downstream  events.  [id number] TIMI Risk Score for ST  Elevation MI:   The patient's TIMI risk score is 2, which indicates a 2.2% risk of all cause mortality at 30 days.       Severity of Illness: The appropriate patient status for this patient is INPATIENT. Inpatient status is judged to be reasonable and necessary in order to provide the required intensity of service to ensure the patient's safety. The patient's presenting symptoms, physical exam findings, and initial radiographic and laboratory data in the context of their chronic comorbidities is felt to place them at high risk for further clinical deterioration. Furthermore, it is not anticipated that the patient will be medically stable for discharge from the hospital within 2 midnights of admission. The following factors support the patient status of inpatient.   " The patient's presenting symptoms include ongoing chest pain indicative of myocardial infarction. " The worrisome physical exam findings include none. " The initial radiographic and laboratory data are worrisome because of ST elevation on  EKG. " The chronic co-morbidities include psoriasis and diabetes mellitus.   * I certify that at the point of admission it is my clinical judgment that the patient will require inpatient hospital care spanning beyond 2 midnights from the point of admission due to high intensity of service, high risk for further deterioration and high frequency of surveillance required.*    For questions or updates, please contact [facility] Please consult [email or web address] for contact info under   CRITICAL CARE TIME: 35 minutes   Signed, Lesleigh Noe, MD  11/29/2020 3:57 PM

## 2020-11-30 ENCOUNTER — Inpatient Hospital Stay (HOSPITAL_COMMUNITY): Payer: Medicare Other

## 2020-11-30 ENCOUNTER — Encounter (HOSPITAL_COMMUNITY): Payer: Self-pay | Admitting: Interventional Cardiology

## 2020-11-30 DIAGNOSIS — I2119 ST elevation (STEMI) myocardial infarction involving other coronary artery of inferior wall: Secondary | ICD-10-CM | POA: Diagnosis not present

## 2020-11-30 DIAGNOSIS — I2510 Atherosclerotic heart disease of native coronary artery without angina pectoris: Secondary | ICD-10-CM

## 2020-11-30 LAB — CBC
HCT: 39.4 % (ref 39.0–52.0)
Hemoglobin: 13.5 g/dL (ref 13.0–17.0)
MCH: 32.9 pg (ref 26.0–34.0)
MCHC: 34.3 g/dL (ref 30.0–36.0)
MCV: 96.1 fL (ref 80.0–100.0)
Platelets: 203 10*3/uL (ref 150–400)
RBC: 4.1 MIL/uL — ABNORMAL LOW (ref 4.22–5.81)
RDW: 15.1 % (ref 11.5–15.5)
WBC: 8.9 10*3/uL (ref 4.0–10.5)
nRBC: 0.2 % (ref 0.0–0.2)

## 2020-11-30 LAB — HEMOGLOBIN A1C
Hgb A1c MFr Bld: 8 % — ABNORMAL HIGH (ref 4.8–5.6)
Mean Plasma Glucose: 182.9 mg/dL

## 2020-11-30 LAB — BASIC METABOLIC PANEL
Anion gap: 8 (ref 5–15)
BUN: 10 mg/dL (ref 8–23)
CO2: 20 mmol/L — ABNORMAL LOW (ref 22–32)
Calcium: 8.6 mg/dL — ABNORMAL LOW (ref 8.9–10.3)
Chloride: 107 mmol/L (ref 98–111)
Creatinine, Ser: 0.76 mg/dL (ref 0.61–1.24)
GFR, Estimated: >60 mL/min (ref >60)
Glucose, Bld: 172 mg/dL — ABNORMAL HIGH (ref 70–99)
Potassium: 4 mmol/L (ref 3.5–5.1)
Sodium: 135 mmol/L (ref 135–145)

## 2020-11-30 LAB — ECHOCARDIOGRAM COMPLETE
Area-P 1/2: 3.34 cm2
Calc EF: 63 %
Height: 62 in
P 1/2 time: 447 msec
S' Lateral: 2.7 cm
Single Plane A2C EF: 65.2 %
Single Plane A4C EF: 55.8 %
Weight: 2208 oz

## 2020-11-30 LAB — GLUCOSE, CAPILLARY
Glucose-Capillary: 139 mg/dL — ABNORMAL HIGH (ref 70–99)
Glucose-Capillary: 187 mg/dL — ABNORMAL HIGH (ref 70–99)
Glucose-Capillary: 132 mg/dL — ABNORMAL HIGH (ref 70–99)

## 2020-11-30 LAB — MRSA PCR SCREENING: MRSA by PCR: NEGATIVE

## 2020-11-30 MED ORDER — INSULIN ASPART 100 UNIT/ML ~~LOC~~ SOLN: 0.0000 [IU] | Freq: Every day | SUBCUTANEOUS | Status: DC

## 2020-11-30 MED ORDER — METOPROLOL TARTRATE 25 MG PO TABS
25.0000 mg | ORAL_TABLET | Freq: Two times a day (BID) | ORAL | Status: DC
  Administered 2020-11-30 – 2020-12-01 (×3): 25 mg via ORAL
  Filled 2020-11-30 (×3): qty 1

## 2020-11-30 MED ORDER — INSULIN ASPART 100 UNIT/ML ~~LOC~~ SOLN
0.0000 [IU] | Freq: Three times a day (TID) | SUBCUTANEOUS | Status: DC
  Administered 2020-11-30 – 2020-12-01 (×3): 2 [IU] via SUBCUTANEOUS

## 2020-11-30 MED ORDER — CHLORHEXIDINE GLUCONATE CLOTH 2 % EX PADS
6.0000 | MEDICATED_PAD | Freq: Every day | CUTANEOUS | Status: DC
  Administered 2020-11-30 – 2020-12-01 (×2): 6 via TOPICAL

## 2020-11-30 NOTE — Progress Notes (Signed)
  Echocardiogram 2D Echocardiogram has been performed.  Tye Savoy 11/30/2020, 10:23 AM

## 2020-11-30 NOTE — Progress Notes (Signed)
CARDIAC REHAB PHASE I   PRE:  Rate/Rhythm: 73 irreg  BP:  Sitting: 130/77      SaO2: 97 RA  MODE:  Ambulation: 370 ft   POST:  Rate/Rhythm: 94 irreg  BP:  Sitting: 129/83    SaO2: 98 RA   Pt ambulated 36ft in hallway independently with steady gait. Pt denies CP, SOB, or dizziness. Pt helped to BR than returned to recliner. Pt educated on importance of ASA, Brilinta, statin, and NTG. Pt given MI book and stent card along with heart healthy and diabetic diets. Reviewed site care, restrictions, and exercise guidelines. Will refer to CRP II [place].  [id number] Reynold Bowen, RN BSN 11/30/2020 10:52 AM

## 2020-11-30 NOTE — Progress Notes (Addendum)
Progress Note  Patient Name: Austin Tucker Date of Encounter: 11/30/2020  Primary Cardiologist: new, Dr. Verdis Prime Patient's medical doctors are at [facility]  Subjective   No recurrent chest pain  Inpatient Medications    Scheduled Meds: . aspirin  81 mg Oral Daily  . atorvastatin  80 mg Oral Daily  . Chlorhexidine Gluconate Cloth  6 each Topical Daily  . heparin  5,000 Units Subcutaneous Q8H  . metoprolol tartrate  12.5 mg Oral BID  . sodium chloride flush  3 mL Intravenous Q12H  . ticagrelor  90 mg Oral BID   Continuous Infusions: . sodium chloride     PRN Meds: sodium chloride, acetaminophen, ondansetron (ZOFRAN) IV, oxyCODONE, sodium chloride flush   Vital Signs    Vitals:   11/30/20 0500 11/30/20 0600 11/30/20 0610 11/30/20 0700  BP: 123/80 (!) 138/125 121/77 120/86  Pulse: 63 78 67 72  Resp: 20 (!) 21 20 16   Temp: 98.5 F (36.9 C)     TempSrc: Oral     SpO2: 98% 91% 98% 99%  Weight:      Height:        Intake/Output Summary (Last 24 hours) at 11/30/2020 0808 Last data filed at 11/30/2020 0500 Gross per 24 hour  Intake 522.43 ml  Output 1625 ml  Net -1102.57 ml    I/O since admission: -1102  Filed Weights   11/29/20 1446  Weight: 62.6 kg    Telemetry    Sinus in the 70s- Personally Reviewed  ECG    11/20/20 ECG (independently read by me): Normal sinus rhythm at 65, Q-wave in lead III and aVF, mild T wave abnormality V6  11/29/20 ECG (independently read by me): Normal sinus rhythm at 81, 1 mm ST elevation inferiorly.  Physical Exam    BP 120/86   Pulse 72   Temp 98.5 F (36.9 C) (Oral)   Resp 16   Ht 5\' 2"  (1.575 m)   Wt 62.6 kg   SpO2 99%   BMI 25.24 kg/m  General: Alert, oriented, no distress.  Skin: normal turgor, no rashes, warm and dry HEENT: Normocephalic, atraumatic. Pupils equal round and reactive to light; sclera anicteric; extraocular muscles intact; Nose without nasal septal hypertrophy Mouth/Parynx benign;   Neck: No JVD, no carotid bruits; normal carotid upstroke Lungs: clear to ausculatation and percussion; no wheezing or rales Chest wall: without tenderness to palpitation Heart: PMI not displaced, RRR, s1 s2 normal, 1/6 systolic murmur, no diastolic murmur, no rubs, gallops, thrills, or heaves Abdomen: soft, nontender; no hepatosplenomehaly, BS+; abdominal aorta nontender and not dilated by palpation. Back: no CVA tenderness Pulses 2+ Musculoskeletal: full range of motion, normal strength, no joint deformities Extremities: no clubbing cyanosis or edema, Homan's sign negative  Neurologic: grossly nonfocal; Cranial nerves grossly wnl Psychologic: Normal mood and affect   Labs    Chemistry Recent Labs  Lab 11/29/20 1448 11/29/20 1500 11/29/20 2055 11/30/20 0546  NA 135 130*  --  135  K 3.9 3.5  --  4.0  CL 105 94*  --  107  CO2 19*  --   --  20*  GLUCOSE 222* 182*  --  172*  BUN 11 11  --  10  CREATININE 0.82 0.50* 0.72 0.76  CALCIUM 8.7*  --   --  8.6*  PROT 6.8  --   --   --   ALBUMIN 3.7  --   --   --   AST 29  --   --   --  ALT 45*  --   --   --   ALKPHOS 83  --   --   --   BILITOT 0.7  --   --   --   GFRNONAA >60  --  >60 >60  ANIONGAP 11  --   --  8     Hematology Recent Labs  Lab 11/29/20 1448 11/29/20 1500 11/29/20 2055 11/30/20 0546  WBC 6.0  --  9.1 8.9  RBC 4.04*  --  3.93* 4.10*  HGB 13.7 13.3 13.0 13.5  HCT 37.9* 39.0 37.9* 39.4  MCV 93.8  --  96.4 96.1  MCH 33.9  --  33.1 32.9  MCHC 36.1*  --  34.3 34.3  RDW 15.1  --  15.1 15.1  PLT 218  --  207 203   HS Trop: 18  Cardiac EnzymesNo results for input(s): TROPONINI in the last 168 hours. No results for input(s): TROPIPOC in the last 168 hours.   BNP Recent Labs  Lab 11/29/20 2055  BNP 65.6     DDimer No results for input(s): DDIMER in the last 168 hours.   Lipid Panel     Component Value Date/Time   CHOL 118 11/29/2020 2055   TRIG 206 (H) 11/29/2020 2055   HDL 21 (L) 11/29/2020  2055   CHOLHDL 5.6 11/29/2020 2055   VLDL 41 (H) 11/29/2020 2055   LDLCALC 56 11/29/2020 2055     Radiology    CARDIAC CATHETERIZATION  Result Date: 11/29/2020  Inferolateral ST elevation myocardial infarction due to thrombotic occlusion of the mid circumflex.  Successful reperfusion with a 3.0 x 18 Onyx postdilated to 3.5 mm in diameter using ultrasound guidance.  Grade 3 flow was noted.  Ostial to proximal 30% left main.  Tandem ectatic areas in the proximal LAD.  50% diffuse narrowing of the mid to distal LAD.  After stenting of the circumflex was a large trifurcating vessel beyond the total occlusion.  Proximal to mid ectasia was noted.  Right coronary is small in caliber and codominant.  50% mid vessel narrowing is noted.  Anterolateral and apical wall motion abnormality.  EF 40%.  LVEDP 20 mmHg. RECOMMENDATIONS:  Aggressive risk factor modification with optimal medical therapy which in this diabetic she will probably include high intensity statin, consideration of SGLT2, smoking cessation, exercise program, with close follow-up.  2D Doppler echocardiogram to reassess LV function in 24 to 48 hours.  Potential early discharge within 24 to 36 hours post procedure.   Cardiac Studies    Inferolateral ST elevation myocardial infarction due to thrombotic occlusion of the mid circumflex.  Successful reperfusion with a 3.0 x 18 Onyx postdilated to 3.5 mm in diameter using ultrasound guidance.  Grade 3 flow was noted.  Ostial to proximal 30% left main.  Tandem ectatic areas in the proximal LAD.  50% diffuse narrowing of the mid to distal LAD.  After stenting of the circumflex was a large trifurcating vessel beyond the total occlusion.  Proximal to mid ectasia was noted.  Right coronary is small in caliber and codominant.  50% mid vessel narrowing is noted.  Anterolateral and apical wall motion abnormality.  EF 40%.  LVEDP 20 mmHg.  RECOMMENDATIONS:   Aggressive risk factor  modification with optimal medical therapy which in this diabetic she will probably include high intensity statin, consideration of SGLT2, smoking cessation, exercise program, with close follow-up.  2D Doppler echocardiogram to reassess LV function in 24 to 48 hours.  Potential early discharge within  24 to 36 hours post procedure.    Intervention     Patient Profile   Austin Tucker is a 64 y.o. male with history of psoriasis, diabetes mellitus to, and prior smoker who developed chest discomfort on 11/29/20 shortly after lunch at 12 noon and was found by EMS to have ST elevation and STEMI activation ensued after transmission with STE in 2,3, avF, and V5-6 with reciprocal ST depression in V1 through V3.  Assessment & Plan    1.  Day 1 status post inferolateral STEMI: s/p vessel PCI to a large circumflex marginal vessel.  No recurrent chest pain.  Will follow up troponin level.  Plan for 2D echo Doppler today.  Resting pulse now in the mid 70s, will increase metoprolol tartrate to 25 mg twice a day.  On DAPT for minimum of 1 year.  Medical therapy for concomitant CAD.  We will add low dose  losartan 12.5 mg today post MI with diabetes and acute EF at 40% and titration as BP allows.  2.  Mixed hyperlipidemia: Now on atorvastatin 80 mg.  May benefit from  [place].  3.  Type 2 diabetes mellitus: Hemoglobin A1c 8.0.  Patient states he was recently diagnosed approximately 2 months ago when hemoglobin A1c was 9.5.  He was unaware of any diabetes since that time.  He may be a good candidate for SGL2 inhibition.  4.  Psoriasis: Followed at [facility] dermatology.  We will transfer out of ICU today to cardiac telemetry.  Possible discharge tomorrow or next day if stable.  Signed, Troy Sine, MD, [facility] 11/30/2020, 8:08 AM

## 2020-12-01 DIAGNOSIS — Z794 Long term (current) use of insulin: Secondary | ICD-10-CM

## 2020-12-01 DIAGNOSIS — E785 Hyperlipidemia, unspecified: Secondary | ICD-10-CM | POA: Diagnosis not present

## 2020-12-01 DIAGNOSIS — I2119 ST elevation (STEMI) myocardial infarction involving other coronary artery of inferior wall: Secondary | ICD-10-CM | POA: Diagnosis not present

## 2020-12-01 DIAGNOSIS — L409 Psoriasis, unspecified: Secondary | ICD-10-CM | POA: Diagnosis not present

## 2020-12-01 DIAGNOSIS — I5022 Chronic systolic (congestive) heart failure: Secondary | ICD-10-CM

## 2020-12-01 DIAGNOSIS — I10 Essential (primary) hypertension: Secondary | ICD-10-CM

## 2020-12-01 DIAGNOSIS — I2510 Atherosclerotic heart disease of native coronary artery without angina pectoris: Secondary | ICD-10-CM

## 2020-12-01 DIAGNOSIS — E118 Type 2 diabetes mellitus with unspecified complications: Secondary | ICD-10-CM

## 2020-12-01 LAB — GLUCOSE, CAPILLARY
Glucose-Capillary: 152 mg/dL — ABNORMAL HIGH (ref 70–99)
Glucose-Capillary: 153 mg/dL — ABNORMAL HIGH (ref 70–99)

## 2020-12-01 LAB — HIGH SENSITIVITY CRP: CRP, High Sensitivity: 6.05 mg/L — ABNORMAL HIGH (ref 0.00–3.00)

## 2020-12-01 MED ORDER — ATORVASTATIN CALCIUM 40 MG PO TABS: 40.0000 mg | ORAL_TABLET | Freq: Every day | ORAL | Status: DC

## 2020-12-01 MED ORDER — ASPIRIN 81 MG PO CHEW: 81.0000 mg | CHEWABLE_TABLET | Freq: Every day | ORAL | 3 refills | Status: AC

## 2020-12-01 MED ORDER — METOPROLOL TARTRATE 25 MG PO TABS: 25.0000 mg | ORAL_TABLET | Freq: Two times a day (BID) | ORAL | 4 refills | Status: AC

## 2020-12-01 MED ORDER — NITROGLYCERIN 0.4 MG SL SUBL: 0.4000 mg | SUBLINGUAL_TABLET | SUBLINGUAL | 1 refills | Status: AC | PRN

## 2020-12-01 MED ORDER — TICAGRELOR 90 MG PO TABS: 90.0000 mg | ORAL_TABLET | Freq: Two times a day (BID) | ORAL | 3 refills | Status: AC

## 2020-12-01 MED ORDER — ATORVASTATIN CALCIUM 40 MG PO TABS: 40.0000 mg | ORAL_TABLET | Freq: Every day | ORAL | 1 refills | Status: DC

## 2020-12-01 MED FILL — METOPROLOL TARTRATE 25 MG T: 25 | 30 days supply | Qty: 60 | Fill #0

## 2020-12-01 MED FILL — BRILINTA 90 MG TABLET: 90 | 90 days supply | Qty: 180 | Fill #0

## 2020-12-01 MED FILL — ATORVASTATIN CALCIUM 40 MG: 40 | 90 days supply | Qty: 90 | Fill #0

## 2020-12-01 MED FILL — ASPIRIN LOW DOSE 81 MG CHEW: 81 | 30 days supply | Qty: 30 | Fill #0

## 2020-12-01 MED FILL — NITROGLYCERIN 0.4 MG TAB SL: 0.4 | 7 days supply | Qty: 25 | Fill #0

## 2020-12-01 NOTE — Progress Notes (Signed)
CARDIAC REHAB PHASE I   PRE:  Rate/Rhythm: 78 SR    BP: sitting 126/84    SaO2: 98 RA  MODE:  Ambulation: 640 ft   POST:  Rate/Rhythm: 98 SR    BP: sitting 131/82     SaO2:   Tolerated very well, no c/o. All questions answered, ready for d/c. Encouraged exercise and CRPII. Understands importance of Brilinta. [phone]   Harriet Masson CES, ACSM 12/01/2020 9:22 AM

## 2020-12-01 NOTE — TOC Benefit Eligibility Note (Signed)
Transition of Care [facility]) Benefit Eligibility Note    Patient Details  Name: Austin Tucker MRN: [id number] Date of Birth: 04/06/1956   Medication/Dose: BRILINTA 90 MG BID  Covered?:  (PLAN EXCLUSION)     Prescription Coverage Preferred Pharmacy: [facility]  Spoke with Person/Company/Phone Number:: VAL  @  [employer]  # [phone]     Prior Approval: Yes (# [phone])     Additional Notes: ALTERNATIVE: CLOPIDOGREL 75 MG DAILY CO-PAY- ZERO DOLLARS  P/A -NO    Mardene Sayer Phone Number: 12/01/2020, 1:07 PM

## 2020-12-01 NOTE — Progress Notes (Signed)
Discharged home accompanied by wife, belongings taken home. 

## 2020-12-01 NOTE — Progress Notes (Signed)
Progress Note  Patient Name: Austin Tucker Date of Encounter: 12/01/2020  [place] Cardiologist: No primary care provider on file. New- [place]. Prefers care thru [facility]. Needs referral to [facility] (More convenient)  Subjective   The patient feels good.  He is ambulating with cardiac rehab.  Inpatient Medications    Scheduled Meds: . aspirin  81 mg Oral Daily  . atorvastatin  80 mg Oral Daily  . Chlorhexidine Gluconate Cloth  6 each Topical Daily  . heparin  5,000 Units Subcutaneous Q8H  . insulin aspart  0-5 Units Subcutaneous QHS  . insulin aspart  0-9 Units Subcutaneous TID WC  . metoprolol tartrate  25 mg Oral BID  . sodium chloride flush  3 mL Intravenous Q12H  . ticagrelor  90 mg Oral BID   Continuous Infusions: . sodium chloride     PRN Meds: sodium chloride, acetaminophen, ondansetron (ZOFRAN) IV, oxyCODONE, sodium chloride flush   Vital Signs    Vitals:   11/30/20 2338 12/01/20 0300 12/01/20 0406 12/01/20 0815  BP: 120/81 114/86 117/90   Pulse: 73 64 71 82  Resp: 19 19 15  (!) 22  Temp: 98.3 F (36.8 C)  98.3 F (36.8 C) 97.8 F (36.6 C)  TempSrc: Oral  Oral Oral  SpO2: 98% 96% 97%   Weight:      Height:        Intake/Output Summary (Last 24 hours) at 12/01/2020 0920 Last data filed at 12/01/2020 0612 Gross per 24 hour  Intake 480 ml  Output 2425 ml  Net -1945 ml   Last 3 Weights 11/30/2020 11/29/2020  Weight (lbs) 127 lb 3.3 oz 138 lb  Weight (kg) 57.7 kg 62.596 kg      Telemetry    Normal sinus rhythm- Personally Reviewed  ECG    11/30/2020 demonstrates PR interval 180 ms.  Small Q waves in 3 and aVF.  Nonspecific ST-T wave abnormality.- Personally Reviewed  Physical Exam   GEN: No acute distress.   Neck: No JVD Cardiac: RRR, no murmurs, rubs, or gallops.  The right radial catheterization site is unremarkable. Respiratory: Clear to auscultation bilaterally. GI: Soft, nontender, non-distended  MS: No  edema; No deformity. Neuro:  Nonfocal  Psych: Normal affect   Labs    Hemoglobin A1c 8.0  CRP 6.05   High Sensitivity Troponin:   Recent Labs  Lab 11/29/20 1448  TROPONINIHS 18*      Chemistry Recent Labs  Lab 11/29/20 1448 11/29/20 1500 11/29/20 2055 11/30/20 0546  NA 135 130*  --  135  K 3.9 3.5  --  4.0  CL 105 94*  --  107  CO2 19*  --   --  20*  GLUCOSE 222* 182*  --  172*  BUN 11 11  --  10  CREATININE 0.82 0.50* 0.72 0.76  CALCIUM 8.7*  --   --  8.6*  PROT 6.8  --   --   --   ALBUMIN 3.7  --   --   --   AST 29  --   --   --   ALT 45*  --   --   --   ALKPHOS 83  --   --   --   BILITOT 0.7  --   --   --   GFRNONAA >60  --  >60 >60  ANIONGAP 11  --   --  8     Hematology Recent Labs  Lab 11/29/20 1448 11/29/20  1500 11/29/20 2055 11/30/20 0546  WBC 6.0  --  9.1 8.9  RBC 4.04*  --  3.93* 4.10*  HGB 13.7 13.3 13.0 13.5  HCT 37.9* 39.0 37.9* 39.4  MCV 93.8  --  96.4 96.1  MCH 33.9  --  33.1 32.9  MCHC 36.1*  --  34.3 34.3  RDW 15.1  --  15.1 15.1  PLT 218  --  207 203    BNP Recent Labs  Lab 11/29/20 2055  BNP 65.6     DDimer No results for input(s): DDIMER in the last 168 hours.   Radiology    CARDIAC CATHETERIZATION  Result Date: 11/29/2020  Inferolateral ST elevation myocardial infarction due to thrombotic occlusion of the mid circumflex.  Successful reperfusion with a 3.0 x 18 Onyx postdilated to 3.5 mm in diameter using ultrasound guidance.  Grade 3 flow was noted.  Ostial to proximal 30% left main.  Tandem ectatic areas in the proximal LAD.  50% diffuse narrowing of the mid to distal LAD.  After stenting of the circumflex was a large trifurcating vessel beyond the total occlusion.  Proximal to mid ectasia was noted.  Right coronary is small in caliber and codominant.  50% mid vessel narrowing is noted.  Anterolateral and apical wall motion abnormality.  EF 40%.  LVEDP 20 mmHg. RECOMMENDATIONS:  Aggressive risk factor modification  with optimal medical therapy which in this diabetic she will probably include high intensity statin, consideration of SGLT2, smoking cessation, exercise program, with close follow-up.  2D Doppler echocardiogram to reassess LV function in 24 to 48 hours.  Potential early discharge within 24 to 36 hours post procedure.  ECHOCARDIOGRAM COMPLETE  Result Date: 11/30/2020    ECHOCARDIOGRAM REPORT   Patient Name:   Austin Tucker Date of Exam: 11/30/2020 Medical Rec #:  [id number]          Height:       62.0 in Accession #:    [id number]         Weight:       138.0 lb Date of Birth:  [birth year]          BSA:          1.633 m Patient Age:    64 years           BP:           122/87 mmHg Patient Gender: M                  HR:           81 bpm. Exam Location:  Inpatient Procedure: 2D Echo Indications:    CAD Native Vessel I25.10  History:        Patient has no prior history of Echocardiogram examinations.  Sonographer:    Mikki Santee RDCS (AE) Referring Phys: [place]  1. Left ventricular ejection fraction, by estimation, is 60 to 65%. The left ventricle has normal function. The left ventricle has no regional wall motion abnormalities. Left ventricular diastolic parameters are consistent with Grade I diastolic dysfunction (impaired relaxation).  2. Right ventricular systolic function is normal. The right ventricular size is normal. There is normal pulmonary artery systolic pressure.  3. The mitral valve is normal in structure. Mild mitral valve regurgitation. No evidence of mitral stenosis.  4. The aortic valve is tricuspid. Aortic valve regurgitation is trivial. No aortic stenosis is present. Aortic regurgitation PHT measures 447 msec.  5. The inferior vena cava  is normal in size with greater than 50% respiratory variability, suggesting right atrial pressure of 3 mmHg. FINDINGS  Left Ventricle: Left ventricular ejection fraction, by estimation, is 60 to 65%. The left ventricle has normal  function. The left ventricle has no regional wall motion abnormalities. The left ventricular internal cavity size was normal in size. There is  no left ventricular hypertrophy. Left ventricular diastolic parameters are consistent with Grade I diastolic dysfunction (impaired relaxation). Right Ventricle: The right ventricular size is normal. No increase in right ventricular wall thickness. Right ventricular systolic function is normal. There is normal pulmonary artery systolic pressure. The tricuspid regurgitant velocity is 1.89 m/s, and  with an assumed right atrial pressure of 3 mmHg, the estimated right ventricular systolic pressure is [id number]. Left Atrium: Left atrial size was normal in size. Right Atrium: Right atrial size was normal in size. Pericardium: There is no evidence of pericardial effusion. Mitral Valve: The mitral valve is normal in structure. Mild mitral valve regurgitation. No evidence of mitral valve stenosis. Tricuspid Valve: The tricuspid valve is normal in structure. Tricuspid valve regurgitation is not demonstrated. No evidence of tricuspid stenosis. Aortic Valve: The aortic valve is tricuspid. Aortic valve regurgitation is trivial. Aortic regurgitation PHT measures 447 msec. No aortic stenosis is present. Pulmonic Valve: The pulmonic valve was normal in structure. Pulmonic valve regurgitation is not visualized. No evidence of pulmonic stenosis. Aorta: The aortic root is normal in size and structure. Venous: The inferior vena cava is normal in size with greater than 50% respiratory variability, suggesting right atrial pressure of 3 mmHg. IAS/Shunts: No atrial level shunt detected by color flow Doppler.  LEFT VENTRICLE PLAX 2D LVIDd:         4.30 cm     Diastology LVIDs:         2.70 cm     LV e' medial:    6.31 cm/s LV PW:         1.00 cm     LV E/e' medial:  9.9 LV IVS:        1.00 cm     LV e' lateral:   9.36 cm/s LVOT diam:     2.20 cm     LV E/e' lateral: 6.7 LV SV:         64 LV SV  Index:   39 LVOT Area:     3.80 cm  LV Volumes (MOD) LV vol d, MOD A2C: 63.0 ml LV vol d, MOD A4C: 66.0 ml LV vol s, MOD A2C: 21.9 ml LV vol s, MOD A4C: 29.2 ml LV SV MOD A2C:     41.1 ml LV SV MOD A4C:     66.0 ml LV SV MOD BP:      43.2 ml RIGHT VENTRICLE RV S prime:     11.60 cm/s TAPSE (M-mode): 1.6 cm LEFT ATRIUM             Index       RIGHT ATRIUM           Index LA diam:        3.20 cm 1.96 cm/m  RA Area:     11.60 cm LA Vol (A2C):   41.9 ml 25.66 ml/m RA Volume:   22.90 ml  14.03 ml/m LA Vol (A4C):   30.7 ml 18.80 ml/m LA Biplane Vol: 36.9 ml 22.60 ml/m  AORTIC VALVE LVOT Vmax:   90.80 cm/s LVOT Vmean:  59.600 cm/s LVOT VTI:    0.168 m  AI PHT:      447 msec  AORTA Ao Root diam: 3.40 cm Ao Asc diam:  2.90 cm MITRAL VALVE                TRICUSPID VALVE MV Area (PHT): 3.34 cm     TR Peak grad:   14.3 mmHg MV Decel Time: 227 msec     TR Vmax:        189.00 cm/s MV E velocity: 62.40 cm/s MV A velocity: 102.00 cm/s  SHUNTS MV E/A ratio:  0.61         Systemic VTI:  0.17 m                             Systemic Diam: 2.20 cm Candee Furbish MD Electronically signed by Candee Furbish MD Signature Date/Time: 11/30/2020/1:11:14 PM    Final     Cardiac Studies   Echocardiography 11/30/2020: IMPRESSIONS    1. Left ventricular ejection fraction, by estimation, is 60 to 65%. The  left ventricle has normal function. The left ventricle has no regional  wall motion abnormalities. Left ventricular diastolic parameters are  consistent with Grade I diastolic  dysfunction (impaired relaxation).  2. Right ventricular systolic function is normal. The right ventricular  size is normal. There is normal pulmonary artery systolic pressure.  3. The mitral valve is normal in structure. Mild mitral valve  regurgitation. No evidence of mitral stenosis.  4. The aortic valve is tricuspid. Aortic valve regurgitation is trivial.  No aortic stenosis is present. Aortic regurgitation PHT measures 447 msec.  5. The  inferior vena cava is normal in size with greater than 50%  respiratory variability, suggesting right atrial pressure of 3 mmHg.     Cardiac catheterization 11/29/2020: Diagnostic Dominance: Right    Intervention         Patient Profile     64 y.o. male with history of psoriasis, diabetes mellitus II, prior smoker who developed chest discomfort on 11/29/20  and had inferolateral STEMI treated with circumflex drug-eluting stent, successfully.  LVEF by echocardiography is normal.  Assessment & Plan    1. Coronary atherosclerotic heart disease with acute inferolateral ST elevation MI due to thrombotic occlusion of the circumflex: Treated successfully in less than 4 hours with mechanical reperfusion.  LVEF 24 hours post infarct reveals normal function.  Plan low-dose beta-blocker therapy, high intensity statin therapy for pleomorphic effect (CRP is 6.1), and DAPT (Brilinta and aspirin) for 12 months.  Brilinta can be switched to Plavix after 1 month.  He lives between [place] and [place].  He wants to keep his care within the [facility] system.  He wishes to follow-up with a [facility] cardiologist.  His first follow-up may need to be with [facility] heart care in [place] if we are unable to arrange the switch over to [place] at discharge.  He is doing quite well at this time and is eligible for discharge today. 2. Diabetes mellitus type 2: He will need management of this problem with Metformin and or an SGLT2.  He is on no medical therapy for diabetes at the time of admission.  His primary physician is Dr. Monico Blitz.  He will need an early follow-up appointment with Dr. Manuella Ghazi 3. Hyperlipidemia, LDL less than 70: Not on a statin prior to admission.  Baseline LDL 79 on admission.  Will discharge on atorvastatin 40 mg/day. 4. Psoriasis vulgaris: Is a risk  factor for acute thrombotic vascular events.  Continue management at [facility]. 5. Dental  disease: Is a risk factor for acute vascular events.  This is being managed at [facility] healthcare.  For questions or updates, please contact [facility] Please consult [email or web address] for contact info under        Signed, Lesleigh Noe, MD  12/01/2020, 9:20 AM

## 2020-12-01 NOTE — Plan of Care (Signed)

## 2020-12-01 NOTE — Discharge Summary (Addendum)
The patient has been seen in conjunction with Ed Blalock, PAC. All aspects of care have been considered and discussed. The patient has been personally interviewed, examined, and all clinical data has been reviewed.  Please see note from earlier today.  Agree with content of this note. Edits have been made as appropriate.    Discharge Summary    Patient ID: Austin Tucker MRN: [id number]; DOB: 15-Sep-1956  Admit date: 11/29/2020 Discharge date: 12/01/2020  Primary Care Provider: Pcp, No  Primary Cardiologist: [facility] Primary Electrophysiologist:  None   Discharge Diagnoses    Principal Problem:   Acute ST elevation myocardial infarction (STEMI) of inferolateral wall (HCC) Active Problems:   Psoriasis   STEMI involving left circumflex coronary artery ([place])   Hypertension   Hyperlipidemia   Type 2 diabetes mellitus with complication, with long-term current use of insulin (HCC)   CAD (coronary artery disease)s/p DES mCx   Chronic systolic heart failure ([place])   Diagnostic Studies/Procedures    Echocardiography 11/30/2020: IMPRESSIONS    1. Left ventricular ejection fraction, by estimation, is 60 to 65%. The  left ventricle has normal function. The left ventricle has no regional  wall motion abnormalities. Left ventricular diastolic parameters are  consistent with Grade I diastolic  dysfunction (impaired relaxation).   2. Right ventricular systolic function is normal. The right ventricular  size is normal. There is normal pulmonary artery systolic pressure.   3. The mitral valve is normal in structure. Mild mitral valve  regurgitation. No evidence of mitral stenosis.   4. The aortic valve is tricuspid. Aortic valve regurgitation is trivial.  No aortic stenosis is present. Aortic regurgitation PHT measures 447 msec.   5. The inferior vena cava is normal in size with greater than 50%  respiratory variability, suggesting right atrial pressure of 3 mmHg.         Cardiac catheterization 11/29/2020: Inferolateral ST elevation myocardial infarction due to thrombotic occlusion of the mid circumflex.  Successful reperfusion with a 3.0 x 18 Onyx postdilated to 3.5 mm in diameter using ultrasound guidance.  Grade 3 flow was noted. Ostial to proximal 30% left main.  Tandem ectatic areas in the proximal LAD.  50% diffuse narrowing of the mid to distal LAD. After stenting of the circumflex was a large trifurcating vessel beyond the total occlusion.  Proximal to mid ectasia was noted. Right coronary is small in caliber and codominant.  50% mid vessel narrowing is noted. Anterolateral and apical wall motion abnormality.  EF 40%.  LVEDP 20 mmHg.   RECOMMENDATIONS:   Aggressive risk factor modification with optimal medical therapy which in this diabetic she will probably include high intensity statin, consideration of SGLT2, smoking cessation, exercise program, with close follow-up. 2D Doppler echocardiogram to reassess LV function in 24 to 48 hours. Potential early discharge within 24 to 36 hours post procedure.   Recommendations  Antiplatelet/Anticoag Recommend uninterrupted dual antiplatelet therapy with Aspirin 81mg  daily and Ticagrelor 90mg  twice daily for a minimum of 12 months (ACS-Class I recommendation).    Diagnostic Dominance: Right      Intervention                _____________   History of Present Illness     Austin Tucker is a 64 y.o. male with pmh of psoriasis, DM2, prior tobacco use,and no prior cardiac hsitory who was evaluated in the ED for chest pain. He reported sudden onset chest discomfort that began after eating lunch. He called EMS  who found him to have ST elevation in 2,3, aVF and V5-V6 with reciprocal changes in V1-V3 and code STEMI was activated. He was brought to the ER for further evaluation.  Hospital Course     Consultants: Cardiac Rehab  Upon arrival to the ED the patient was emergently taken up to the  cath lab. Cardiac cath showed 30% ost-prox left main, 50% diffuse narrowing of the mid to distal KAD, 50% mRCA narrowing, total occlusion of mid Cx marginal vessel, EF 40%, LVEDP 52mmHg. The patient was treated with stenting to thrombotic occlusion of the mid circumflex marginal vessel. The patient tolerated the procedure well. He was started on DAPT for 1 year with Aspirin and Brilinta. He was also started on Losartan 12.5mg  daily and Lopressor 25mg  BID. LDL came back at 79 and he was started on Atrovastatin. Echo showed LVEF 60-65%, G1DD, mild MR, trivial AI. The patient ambulated with cardiac rehab without chest pain or sob. Cath site remained stable. Follow-up labs showed stable creatinine and Hgb. Plan to discharge patient on Aspirin and Brilinta for 1 month with the option of switching to plavix after 1 month. Will send in rx for losartan 25mg  daily, Lopressor 24mg  BID, SL Nitro and atorvastatin 40 mg daily. Patient wants to follow-up with [facility] clinic at [facility] however will get him an appointment with [facility] in [place] in the interim. He was instructed to have close follow-up with PCP for diabetes.   The patient was evaluated by Dr. Tamala Julian on 12/01/20 and felt to be stable for discharge.   Did the patient have an acute coronary syndrome (MI, NSTEMI, STEMI, etc) this admission?:  Yes                               AHA/ACC Clinical Performance & Quality Measures: Aspirin prescribed? - Yes ADP Receptor Inhibitor (Plavix/Clopidogrel, Brilinta/Ticagrelor or Effient/Prasugrel) prescribed (includes medically managed patients)? - Yes Beta Blocker prescribed? - Yes High Intensity Statin (Lipitor 40-80mg  or Crestor 20-40mg ) prescribed? - Yes EF assessed during THIS hospitalization? - Yes For EF <40%, was ACEI/ARB prescribed? - Not Applicable (EF >/= [id number]) For EF <40%, Aldosterone Antagonist (Spironolactone or Eplerenone) prescribed? - Not Applicable (EF >/= [id number]) Cardiac Rehab Phase II ordered  (including medically managed patients)? - Yes       _____________  Discharge Vitals Blood pressure 131/82, pulse 82, temperature 97.8 F (36.6 C), temperature source Oral, resp. rate (!) 22, height 5\' 2"  (1.575 m), weight 57.7 kg, SpO2 97 %.  Filed Weights   11/29/20 1446 11/30/20 1940  Weight: 62.6 kg 57.7 kg    Labs & Radiologic Studies    CBC Recent Labs    11/29/20 2055 11/30/20 0546  WBC 9.1 8.9  HGB 13.0 13.5  HCT 37.9* 39.4  MCV 96.4 96.1  PLT 207 [id number]   Basic Metabolic Panel Recent Labs    11/29/20 1448 11/29/20 1500 11/29/20 2055 11/30/20 0546  NA 135 130*  --  135  K 3.9 3.5  --  4.0  CL 105 94*  --  107  CO2 19*  --   --  20*  GLUCOSE 222* 182*  --  172*  BUN 11 11  --  10  CREATININE 0.82 0.50* 0.72 0.76  CALCIUM 8.7*  --   --  8.6*   Liver Function Tests Recent Labs    11/29/20 1448  AST 29  ALT 45*  ALKPHOS 83  BILITOT  0.7  PROT 6.8  ALBUMIN 3.7   No results for input(s): LIPASE, AMYLASE in the last 72 hours. High Sensitivity Troponin:   Recent Labs  Lab 11/29/20 1448  TROPONINIHS 18*    BNP Invalid input(s): POCBNP D-Dimer No results for input(s): DDIMER in the last 72 hours. Hemoglobin A1C Recent Labs    11/30/20 0546  HGBA1C 8.0*   Fasting Lipid Panel Recent Labs    11/29/20 2055  CHOL 118  HDL 21*  LDLCALC 56  TRIG 206*  CHOLHDL 5.6   Thyroid Function Tests Recent Labs    11/29/20 2055  TSH 0.924   _____________  CARDIAC CATHETERIZATION  Result Date: 11/29/2020  Inferolateral ST elevation myocardial infarction due to thrombotic occlusion of the mid circumflex.  Successful reperfusion with a 3.0 x 18 Onyx postdilated to 3.5 mm in diameter using ultrasound guidance.  Grade 3 flow was noted.  Ostial to proximal 30% left main.  Tandem ectatic areas in the proximal LAD.  50% diffuse narrowing of the mid to distal LAD.  After stenting of the circumflex was a large trifurcating vessel beyond the total occlusion.   Proximal to mid ectasia was noted.  Right coronary is small in caliber and codominant.  50% mid vessel narrowing is noted.  Anterolateral and apical wall motion abnormality.  EF 40%.  LVEDP 20 mmHg. RECOMMENDATIONS:  Aggressive risk factor modification with optimal medical therapy which in this diabetic she will probably include high intensity statin, consideration of SGLT2, smoking cessation, exercise program, with close follow-up.  2D Doppler echocardiogram to reassess LV function in 24 to 48 hours.  Potential early discharge within 24 to 36 hours post procedure.  ECHOCARDIOGRAM COMPLETE  Result Date: 11/30/2020    ECHOCARDIOGRAM REPORT   Patient Name:   Austin Tucker Date of Exam: 11/30/2020 Medical Rec #:  [id number]          Height:       62.0 in Accession #:    [id number]         Weight:       138.0 lb Date of Birth:  07-19-1956          BSA:          1.633 m Patient Age:    84 years           BP:           122/87 mmHg Patient Gender: M                  HR:           81 bpm. Exam Location:  Inpatient Procedure: 2D Echo Indications:    CAD Native Vessel I25.10  History:        Patient has no prior history of Echocardiogram examinations.  Sonographer:    Mikki Santee RDCS (AE) Referring Phys: [place]  1. Left ventricular ejection fraction, by estimation, is 60 to 65%. The left ventricle has normal function. The left ventricle has no regional wall motion abnormalities. Left ventricular diastolic parameters are consistent with Grade I diastolic dysfunction (impaired relaxation).  2. Right ventricular systolic function is normal. The right ventricular size is normal. There is normal pulmonary artery systolic pressure.  3. The mitral valve is normal in structure. Mild mitral valve regurgitation. No evidence of mitral stenosis.  4. The aortic valve is tricuspid. Aortic valve regurgitation is trivial. No aortic stenosis is present. Aortic regurgitation PHT measures 447 msec.   5.  The inferior vena cava is normal in size with greater than 50% respiratory variability, suggesting right atrial pressure of 3 mmHg. FINDINGS  Left Ventricle: Left ventricular ejection fraction, by estimation, is 60 to 65%. The left ventricle has normal function. The left ventricle has no regional wall motion abnormalities. The left ventricular internal cavity size was normal in size. There is  no left ventricular hypertrophy. Left ventricular diastolic parameters are consistent with Grade I diastolic dysfunction (impaired relaxation). Right Ventricle: The right ventricular size is normal. No increase in right ventricular wall thickness. Right ventricular systolic function is normal. There is normal pulmonary artery systolic pressure. The tricuspid regurgitant velocity is 1.89 m/s, and  with an assumed right atrial pressure of 3 mmHg, the estimated right ventricular systolic pressure is 17.3 mmHg. Left Atrium: Left atrial size was normal in size. Right Atrium: Right atrial size was normal in size. Pericardium: There is no evidence of pericardial effusion. Mitral Valve: The mitral valve is normal in structure. Mild mitral valve regurgitation. No evidence of mitral valve stenosis. Tricuspid Valve: The tricuspid valve is normal in structure. Tricuspid valve regurgitation is not demonstrated. No evidence of tricuspid stenosis. Aortic Valve: The aortic valve is tricuspid. Aortic valve regurgitation is trivial. Aortic regurgitation PHT measures 447 msec. No aortic stenosis is present. Pulmonic Valve: The pulmonic valve was normal in structure. Pulmonic valve regurgitation is not visualized. No evidence of pulmonic stenosis. Aorta: The aortic root is normal in size and structure. Venous: The inferior vena cava is normal in size with greater than 50% respiratory variability, suggesting right atrial pressure of 3 mmHg. IAS/Shunts: No atrial level shunt detected by color flow Doppler.  LEFT VENTRICLE PLAX 2D LVIDd:          4.30 cm     Diastology LVIDs:         2.70 cm     LV e' medial:    6.31 cm/s LV PW:         1.00 cm     LV E/e' medial:  9.9 LV IVS:        1.00 cm     LV e' lateral:   9.36 cm/s LVOT diam:     2.20 cm     LV E/e' lateral: 6.7 LV SV:         64 LV SV Index:   39 LVOT Area:     3.80 cm  LV Volumes (MOD) LV vol d, MOD A2C: 63.0 ml LV vol d, MOD A4C: 66.0 ml LV vol s, MOD A2C: 21.9 ml LV vol s, MOD A4C: 29.2 ml LV SV MOD A2C:     41.1 ml LV SV MOD A4C:     66.0 ml LV SV MOD BP:      43.2 ml RIGHT VENTRICLE RV S prime:     11.60 cm/s TAPSE (M-mode): 1.6 cm LEFT ATRIUM             Index       RIGHT ATRIUM           Index LA diam:        3.20 cm 1.96 cm/m  RA Area:     11.60 cm LA Vol (A2C):   41.9 ml 25.66 ml/m RA Volume:   22.90 ml  14.03 ml/m LA Vol (A4C):   30.7 ml 18.80 ml/m LA Biplane Vol: 36.9 ml 22.60 ml/m  AORTIC VALVE LVOT Vmax:   90.80 cm/s LVOT Vmean:  59.600 cm/s LVOT VTI:  0.168 m AI PHT:      447 msec  AORTA Ao Root diam: 3.40 cm Ao Asc diam:  2.90 cm MITRAL VALVE                TRICUSPID VALVE MV Area (PHT): 3.34 cm     TR Peak grad:   14.3 mmHg MV Decel Time: 227 msec     TR Vmax:        189.00 cm/s MV E velocity: 62.40 cm/s MV A velocity: 102.00 cm/s  SHUNTS MV E/A ratio:  0.61         Systemic VTI:  0.17 m                             Systemic Diam: 2.20 cm Candee Furbish MD Electronically signed by Candee Furbish MD Signature Date/Time: 11/30/2020/1:11:14 PM    Final    Disposition   Pt is being discharged home today in good condition.  Follow-up Plans & Appointments     Follow-up Information     [place] Follow up.   Specialty: Cardiology Why: If you do not get a call from the office early next week please call and scheudle to be seen withint 14 days of discharge.  Contact information: [address], [place] [phone]               Discharge Instructions     Amb Referral to Cardiac Rehabilitation   Complete  by: As directed    Diagnosis:  Coronary Stents STEMI     After initial evaluation and assessments completed: Virtual Based Care may be provided alone or in conjunction with Phase 2 Cardiac Rehab based on patient barriers.: Yes       Discharge Medications   Allergies as of 12/01/2020       Reactions   Eggshell Membrane (chicken) [egg Shells] Hives   Sesame Seed (diagnostic) Hives        Medication List     TAKE these medications    aspirin 81 MG chewable tablet Chew 1 tablet (81 mg total) by mouth daily. Start taking on: December 02, 2020   atorvastatin 40 MG tablet Commonly known as: LIPITOR Take 1 tablet (40 mg total) by mouth daily. Start taking on: December 02, 2020   metoprolol tartrate 25 MG tablet Commonly known as: LOPRESSOR Take 1 tablet (25 mg total) by mouth 2 (two) times daily.   nitroGLYCERIN 0.4 MG SL tablet Commonly known as: Nitrostat Place 1 tablet (0.4 mg total) under the tongue every 5 (five) minutes as needed for chest pain.   ticagrelor 90 MG Tabs tablet Commonly known as: BRILINTA Take 1 tablet (90 mg total) by mouth 2 (two) times daily.           Outstanding Labs/Studies   N/A  Duration of Discharge Encounter   Greater than 30 minutes including physician time.  Signed, Cadence Ninfa Meeker, PA-C 12/01/2020, 11:19 AM

## 2020-12-04 ENCOUNTER — Other Ambulatory Visit: Payer: Self-pay | Admitting: *Deleted

## 2020-12-04 ENCOUNTER — Telehealth: Payer: Self-pay | Admitting: *Deleted

## 2020-12-04 NOTE — Telephone Encounter (Signed)
Attempted to schedule no ans no vm  

## 2020-12-04 NOTE — Patient Outreach (Signed)
[facility]) Care Management  12/04/2020  Tycho A Bertoli 01-21-1956 [id number]   Red flag Alert   Day:1 Date: 12/03/20 Reason : Who reached Patient  Scheduled follow-up? No    Outreach Attempt  Subjective: Outreach call to patient, noted preferred language Arabic  using pacific interpreter [place], [phone], Call to home number received message no longer in service, placed call to mobile number, unable to leave a voicemail message as voicemail not activated.    Plan Will send unsuccessful outreach letter, Will plan return call in the next 4 business days.    Egbert Garibaldi, RN, BSN  [facility] Care Management,Care Management Coordinator  [phone]- Mobile [phone]- Toll Free Main Office

## 2020-12-04 NOTE — Telephone Encounter (Signed)
-----   Message from Cadence David Stall, PA-C sent at 12/01/2020 11:13 AM EST ----- Regarding: hospital follow-up Pt admitted with STEMI s/p stenting. He normally follows with [facility] and will eventually follow with [facility] in [place]. However, he would like to see [facility] for immediate post-hospital follow-up. HE needs a follow-up with any provider in the next 2 weeks. Will be TOC apt.   [place] TOC: please arrange TOC phone call  Thanks!

## 2020-12-05 NOTE — Telephone Encounter (Signed)
2nd attempted to reach patient for TOC and to schedule follow up appointment.   Using [employer] ID [phone] attempted to reach patient. Interpreter left message on home number. No answer or voicemail on cell number.   Will try again later.

## 2020-12-07 ENCOUNTER — Other Ambulatory Visit: Payer: Self-pay | Admitting: *Deleted

## 2020-12-07 NOTE — Patient Outreach (Signed)
[facility]) Care Management  12/07/2020  Austin Tucker 1956-08-03 [id number]   EMMI Red Flags  #1 Day:1 Date: 12/03/20 Reason  Who reached Patient  Scheduled follow-up? No   #2 Day: Date: Reason: Scheduled follow-up?   No No  Lost interest in things?   Yes    Sad/hopeless/anxious/empty?   Yes    Other questions/problems?   Yes No   Outreach attempt #2 Subjective: Successful outreach call to patient , with pacific interpreter line [name] [phone]. At outreach call patient states that English is his primary language and questions how Arabic was listed. He discussed speaking other Hindi language. [employer] interpreter dropped from call. I updated patient preferred language in record.  Explained reason for the call, automated emmi calls after discharge. Patient able to recall receiving calls, Addressed EMMI red alert flags. Scheduled Follow up ? NO, patient states that he has spoken with Cardiac Rehab on today and regarding setting up an appointment, he voiced understanding that he will need to see Cardiology first. Patient states he has contact number for Cardiology office in [place] and will call after we complete this call.  EMMI Red Flag #2- Lost interest in things, feeling sad/hopeless- Yes Patient very jovial and states yes he responded yes to the question stating that he is sad because of  changes since discharge, states I can't eat what I want to , my wife is sticking to the diet provided, he can't drive for 2 weeks , his wife has to drive him and he always has been the driver. Patient states overall he understands this discharge instructions and reasons for change in diet, short term limited activity restrictions. He denies need for counseling support.  Conditions  Patient denies chest pain or discomfort since reason hospital admission for MI/stent, he states feeling good.  He is able to verbalize understanding of how to take sl NTG. He reports cath site at  wrist unremarkable.  He discussed plan to follow up with Dr. Sherryll Burger regarding diabetes.  He is tolerating activity, walking indoors  without problems . Medications  Taking medications as prescribed, denies cost concerns.  Appointments  Patient has followed up with Cardiac Rehab regarding program. Cardiology, verified patient has contact number to make appointment declined needing assistance.  Patient also agreeable to scheduling appointment with primary care provider.   Explained [facility] care management program, patient reports that he is doing well, declines need for further ongoing  follow up, education needs at this time.   Plan EMMI red flag alerts addressed, will plan case closure    Egbert Garibaldi, RN, BSN  [facility] Care Management,Care Management Coordinator  [phone]- Mobile [phone]- Toll Free Main Office

## 2020-12-07 NOTE — Telephone Encounter (Signed)
Patient declined to schedule with [facility] as he has approved charity care

## 2020-12-08 DIAGNOSIS — Z87891 Personal history of nicotine dependence: Secondary | ICD-10-CM | POA: Diagnosis not present

## 2020-12-08 DIAGNOSIS — Z955 Presence of coronary angioplasty implant and graft: Secondary | ICD-10-CM | POA: Diagnosis not present

## 2020-12-08 DIAGNOSIS — E1100 Type 2 diabetes mellitus with hyperosmolarity without nonketotic hyperglycemic-hyperosmolar coma (NKHHC): Secondary | ICD-10-CM | POA: Diagnosis not present

## 2020-12-08 DIAGNOSIS — I2119 ST elevation (STEMI) myocardial infarction involving other coronary artery of inferior wall: Secondary | ICD-10-CM | POA: Diagnosis not present

## 2020-12-08 DIAGNOSIS — E7800 Pure hypercholesterolemia, unspecified: Secondary | ICD-10-CM | POA: Diagnosis not present

## 2020-12-28 ENCOUNTER — Encounter (HOSPITAL_COMMUNITY): Payer: Self-pay

## 2020-12-28 ENCOUNTER — Other Ambulatory Visit: Payer: Self-pay

## 2020-12-28 ENCOUNTER — Encounter (HOSPITAL_COMMUNITY)
Admission: RE | Admit: 2020-12-28 | Discharge: 2020-12-28 | Disposition: A | Discharge status: Home / Self Care | Payer: Medicare Other | Source: Ambulatory Visit | Attending: Interventional Cardiology | Admitting: Interventional Cardiology

## 2020-12-28 VITALS — BP 102/58 | HR 76 | Ht 62.0 in | Wt 129.6 lb

## 2020-12-28 DIAGNOSIS — I2121 ST elevation (STEMI) myocardial infarction involving left circumflex coronary artery: Secondary | ICD-10-CM | POA: Insufficient documentation

## 2020-12-28 DIAGNOSIS — Z955 Presence of coronary angioplasty implant and graft: Secondary | ICD-10-CM | POA: Insufficient documentation

## 2020-12-28 NOTE — Progress Notes (Signed)
Cardiac/Pulmonary Rehab Medication Review by a Pharmacist  Does the patient  feel that his/her medications are working for him/her?  yes  Has the patient been experiencing any side effects to the medications prescribed?  no  Does the patient measure his/her own blood pressure or blood glucose at home?  yes   Does the patient have any problems obtaining medications due to transportation or finances?   no  Understanding of regimen: good Understanding of indications: good Potential of compliance: excellent  Questions asked to Determine Patient Understanding of Medication Regimen:  1. What is the name of the medication?  2. What is the medication used for?  3. When should it be taken?  4. How much should be taken?  5. How will you take it?  6. What side effects should you report?  Understanding Defined as: Excellent: All questions above are correct Good: Questions 1-4 are correct Fair: Questions 1-2 are correct  Poor: 1 or none of the above questions are correct   Pharmacist comments: Patient presents for cardiac rehab today. We reviewed his medications. He did not tolerate the lisinopril because it exacerbated his psoriasis. He has just started losartan and will see how that does. He  Is tolerating his current regimen. He does monitor his BP and BS. No other issues identified.  Thanks for the opportunity to participate in the care of this patient,  Isac Sarna, BS [place], [place] Clinical Pharmacist Pager [phone] 12/28/2020 9:09 AM

## 2020-12-28 NOTE — Progress Notes (Signed)
Cardiac Individual Treatment Plan  Patient Details  Name: Austin Tucker MRN: [id number] Date of Birth: March 11, 1956 Referring Provider:   Flowsheet Row CARDIAC REHAB PHASE II ORIENTATION from 12/28/2020 in [place]  Referring Provider Dr. 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 (HCC)  S/P coronary artery stent placement  Patient's Home Medications on Admission:  Current Outpatient Medications:    losartan (COZAAR) 25 MG tablet, Take 25 mg by mouth daily., Disp: , Rfl:    aspirin 81 MG chewable tablet, Chew 1 tablet (81 mg total) by mouth daily., Disp: 90 tablet, Rfl: 3   atorvastatin (LIPITOR) 40 MG tablet, Take 1 tablet (40 mg total) by mouth daily., Disp: 90 tablet, Rfl: 1   metFORMIN (GLUCOPHAGE-XR) 500 MG 24 hr tablet, Take 500 mg by mouth daily with breakfast., Disp: , Rfl:    metoprolol tartrate (LOPRESSOR) 25 MG tablet, Take 1 tablet (25 mg total) by mouth 2 (two) times daily., Disp: 60 tablet, Rfl: 4   nitroGLYCERIN (NITROSTAT) 0.4 MG SL tablet, Place 1 tablet (0.4 mg total) under the tongue every 5 (five) minutes as needed for chest pain., Disp: 25 tablet, Rfl: 1   ticagrelor (BRILINTA) 90 MG TABS tablet, Take 1 tablet (90 mg total) by mouth 2 (two) times daily., Disp: 180 tablet, Rfl: 3  Past Medical History: No past medical history on file.  Tobacco Use: Social History   Tobacco Use  Smoking Status Former Smoker  Smokeless Tobacco Never Used    Labs: Recent [employer] for [facility] Cardiac and Pulmonary Rehab Latest Ref Rng & Units 11/29/2020 11/29/2020 11/29/2020 11/30/2020   Cholestrol 0 - 200 mg/dL 127 - 118 -   LDLCALC 0 - 99 mg/dL 79 - 56 -   HDL >40 mg/dL 24(L) - 21(L) -   Trlycerides <150 mg/dL 119  - 206(H) -   Hemoglobin A1c 4.8 - 5.6 % 7.8(H) - - 8.0(H)   TCO2 22 - 32 mmol/L - 20(L) - -      Capillary Blood Glucose: Lab Results  Component Value Date   GLUCAP 153 (H) 12/01/2020   GLUCAP 152 (H) 12/01/2020   GLUCAP 132 (H) 11/30/2020   GLUCAP 187 (H) 11/30/2020   GLUCAP 139 (H) 11/30/2020     Exercise Target Goals: Exercise Program Goal: Individual exercise prescription set using results from initial 6 min walk test and THRR while considering  patients activity barriers and safety.   Exercise Prescription Goal: Starting with aerobic activity 30 plus minutes a day, 3 days per week for initial exercise prescription. Provide home exercise prescription and guidelines that participant acknowledges understanding prior to discharge.  Activity Barriers & Risk Stratification:  Activity Barriers & Cardiac Risk Stratification - 12/28/20 0922      Activity Barriers & Cardiac Risk Stratification   Activity Barriers None    Cardiac Risk Stratification High           6 Minute Walk:  6 Minute Walk    Row Name 12/28/20 1018         6 Minute Walk   Phase Initial     Distance 1400 feet     Walk Time 6 minutes     # of Rest Breaks 0     MPH  2.7     METS 3.18     RPE 8     VO2 Peak 11.15     Symptoms No     Resting HR 76 bpm     Resting BP 102/58     Resting Oxygen Saturation  99 %     Exercise Oxygen Saturation  during 6 min walk 100 %     Max Ex. HR 80 bpm     Max Ex. BP 112/62     2 Minute Post BP 110/60            Oxygen Initial Assessment:   Oxygen Re-Evaluation:   Oxygen Discharge (Final Oxygen Re-Evaluation):   Initial Exercise Prescription:  Initial Exercise Prescription - 12/28/20 1000      Date of Initial Exercise RX and Referring Provider   Date 12/28/20    Referring Provider Dr. Katrinka BlazingSmith    Expected Discharge Date 03/16/21      NuStep   Level 1    SPM 60    Minutes 39      Intensity   THRR 40-80% of Max Heartrate 62-125    Ratings of  Perceived Exertion 11-15      Resistance Training   Training Prescription Yes    Weight 2    Reps 10-15           Perform Capillary Blood Glucose checks as needed.  Exercise Prescription Changes:   Exercise Comments:   Exercise Goals and Review:   Exercise Goals    Row Name 12/28/20 1020             Exercise Goals   Increase Physical Activity Yes       Intervention Provide advice, education, support and counseling about physical activity/exercise needs.;Develop an individualized exercise prescription for aerobic and resistive training based on initial evaluation findings, risk stratification, comorbidities and participant's personal goals.       Expected Outcomes Short Term: Attend rehab on a regular basis to increase amount of physical activity.;Long Term: Add in home exercise to make exercise part of routine and to increase amount of physical activity.;Long Term: Exercising regularly at least 3-5 days a week.       Increase Strength and Stamina Yes       Intervention Develop an individualized exercise prescription for aerobic and resistive training based on initial evaluation findings, risk stratification, comorbidities and participant's personal goals.;Provide advice, education, support and counseling about physical activity/exercise needs.       Expected Outcomes Short Term: Increase workloads from initial exercise prescription for resistance, speed, and METs.;Short Term: Perform resistance training exercises routinely during rehab and add in resistance training at home;Long Term: Improve cardiorespiratory fitness, muscular endurance and strength as measured by increased METs and functional capacity (6MWT)       Able to understand and use rate of perceived exertion (RPE) scale Yes       Intervention Provide education and explanation on how to use RPE scale       Expected Outcomes Short Term: Able to use RPE daily in rehab to express subjective intensity level;Long Term:  Able  to use RPE to guide intensity level when exercising independently       Knowledge and understanding of Target Heart Rate Range (THRR) Yes       Intervention Provide education and explanation of THRR including how the numbers were predicted and where they are located for reference       Expected Outcomes Short Term: Able to  state/look up THRR;Long Term: Able to use THRR to govern intensity when exercising independently;Short Term: Able to use daily as guideline for intensity in rehab       Able to check pulse independently Yes       Intervention Provide education and demonstration on how to check pulse in carotid and radial arteries.;Review the importance of being able to check your own pulse for safety during independent exercise       Expected Outcomes Short Term: Able to explain why pulse checking is important during independent exercise;Long Term: Able to check pulse independently and accurately       Understanding of Exercise Prescription Yes       Intervention Provide education, explanation, and written materials on patient's individual exercise prescription       Expected Outcomes Short Term: Able to explain program exercise prescription;Long Term: Able to explain home exercise prescription to exercise independently              Exercise Goals Re-Evaluation :    Discharge Exercise Prescription (Final Exercise Prescription Changes):   Nutrition:  Target Goals: Understanding of nutrition guidelines, daily intake of sodium 1500mg , cholesterol 200mg , calories 30% from fat and 7% or less from saturated fats, daily to have 5 or more servings of fruits and vegetables.  Biometrics:  Pre Biometrics - 12/28/20 1021      Pre Biometrics   Height 5\' 2"  (1.575 m)    Weight 58.8 kg    Waist Circumference 35 inches    Hip Circumference 35.5 inches    Waist to Hip Ratio 0.99 %    BMI (Calculated) 23.7    Triceps Skinfold 5 mm    % Body Fat 19.9 %    Grip Strength 22.1 kg    Flexibility  10.5 in    Single Leg Stand 22 seconds            Nutrition Therapy Plan and Nutrition Goals:  Nutrition Therapy & Goals - 12/28/20 1037      Personal Nutrition Goals   Comments Patient scored 33 on his diet assessment. He says his wife has put him on a strick diet. He is a newly diagnosed DM. He is interested in seeing a RD but wants to see one that specializes in [place] foods. I called Dr. Trena Platt office and requested a referral to and RD. They said they would call him this. Reviewed patient's scores with him and explained handout provided on making healthier food choices. Will continue to monitor.      Intervention Plan   Intervention Nutrition handout(s) given to patient.           Nutrition Assessments:  Nutrition Assessments - 12/28/20 1042      MEDFICTS Scores   Pre Score 33          MEDIFICTS Score Key:  ?70 Need to make dietary changes   40-70 Heart Healthy Diet  ? 40 Therapeutic Level Cholesterol Diet   Picture Your Plate Scores:  <81 Unhealthy dietary pattern with much room for improvement.  41-50 Dietary pattern unlikely to meet recommendations for good health and room for improvement.  51-60 More healthful dietary pattern, with some room for improvement.   >60 Healthy dietary pattern, although there may be some specific behaviors that could be improved.    Nutrition Goals Re-Evaluation:   Nutrition Goals Discharge (Final Nutrition Goals Re-Evaluation):   Psychosocial: Target Goals: Acknowledge presence or absence of significant depression and/or stress, maximize coping skills, provide  positive support system. Participant is able to verbalize types and ability to use techniques and skills needed for reducing stress and depression.  Initial Review & Psychosocial Screening:  Initial Psych Review & Screening - 12/28/20 1033      Initial Review   Current issues with None Identified      Family Dynamics   Good Support System? Yes    Comments  Patient lives with his wife of many years. He has one daughter that lives in [place] that he is close with.  He says his wife is his support system and his chruch family. He denies any depression, stress, or anxiety. He says he is excited about starting cardiac rehab. He demonstrates a very positive outlook and attitude regarding his furture and health.      Barriers   Psychosocial barriers to participate in program Psychosocial barriers identified (see note)      Screening Interventions   Interventions Encouraged to exercise;Program counselor consult    Expected Outcomes Short Term goal: Identification and review with participant of any Quality of Life or Depression concerns found by scoring the questionnaire.           Quality of Life Scores:  Quality of Life - 12/28/20 1022      Quality of Life   Select Quality of Life      Quality of Life Scores   Health/Function Pre 18.8 %    Socioeconomic Pre 22.58 %    Psych/Spiritual Pre 21.79 %    Family Pre 19.8 %    GLOBAL Pre 20.27 %          Scores of 19 and below usually indicate a poorer quality of life in these areas.  A difference of  2-3 points is a clinically meaningful difference.  A difference of 2-3 points in the total score of the Quality of Life Index has been associated with significant improvement in overall quality of life, self-image, physical symptoms, and general health in studies assessing change in quality of life.  PHQ-9: Recent Review Flowsheet Data    Depression screen [facility] 2/9 12/28/2020   Decreased Interest 0   Down, Depressed, Hopeless 0   PHQ - 2 Score 0   Altered sleeping 1    Tired, decreased energy 0   Change in appetite 0   Feeling bad or failure about yourself  0   Trouble concentrating 0   Moving slowly or fidgety/restless 0   Suicidal thoughts 0   PHQ-9 Score 1   Difficult doing work/chores Not difficult at all     Interpretation of Total Score  Total Score Depression Severity:  1-4 =  Minimal depression, 5-9 = Mild depression, 10-14 = Moderate depression, 15-19 = Moderately severe depression, 20-27 = Severe depression   Psychosocial Evaluation and Intervention:  Psychosocial Evaluation - 12/28/20 1036      Psychosocial Evaluation & Interventions   Interventions Stress management education;Relaxation education;Encouraged to exercise with the program and follow exercise prescription    Comments Patient has no psychosocial issues identified at his orientation visit. His QOL score was 20.27 overall scoring lowest in health/function. His PHQ-9 socre was 1. Will continue to monitor.    Expected Outcomes Patient will have no psychosocial issues identified at discharge.    Continue Psychosocial Services  No Follow up required           Psychosocial Re-Evaluation:   Psychosocial Discharge (Final Psychosocial Re-Evaluation):   Vocational Rehabilitation: Provide vocational rehab assistance to qualifying candidates.  Vocational Rehab Evaluation & Intervention:  Vocational Rehab - 12/28/20 1043      Initial Vocational Rehab Evaluation & Intervention   Assessment shows need for Vocational Rehabilitation No      Vocational Rehab Re-Evaulation   Comments Patient is retired and does not need vocational rehab.           Education: Education Goals: Education classes will be provided on a weekly basis, covering required topics. Participant will state understanding/return demonstration of topics presented.  Learning Barriers/Preferences:  Learning Barriers/Preferences - 12/28/20 1042      Learning Barriers/Preferences   Learning Barriers None    Learning Preferences Video;Written Material;Audio           Education Topics: Hypertension, Hypertension Reduction -Define heart disease and high blood pressure. Discus how high blood pressure affects the body and ways to reduce high blood pressure.   Exercise and Your Heart -Discuss why it is important to exercise,  the FITT principles of exercise, normal and abnormal responses to exercise, and how to exercise safely.   Angina -Discuss definition of angina, causes of angina, treatment of angina, and how to decrease risk of having angina.   Cardiac Medications -Review what the following cardiac medications are used for, how they affect the body, and side effects that may occur when taking the medications.  Medications include Aspirin, Beta blockers, calcium channel blockers, ACE Inhibitors, angiotensin receptor blockers, diuretics, digoxin, and antihyperlipidemics.   Congestive Heart Failure -Discuss the definition of CHF, how to live with CHF, the signs and symptoms of CHF, and how keep track of weight and sodium intake.   Heart Disease and Intimacy -Discus the effect sexual activity has on the heart, how changes occur during intimacy as we age, and safety during sexual activity.   Smoking Cessation / COPD -Discuss different methods to quit smoking, the health benefits of quitting smoking, and the definition of COPD.   Nutrition I: Fats -Discuss the types of cholesterol, what cholesterol does to the heart, and how cholesterol levels can be controlled.   Nutrition II: Labels -Discuss the different components of food labels and how to read food label   Heart Parts/Heart Disease and PAD -Discuss the anatomy of the heart, the pathway of blood circulation through the heart, and these are affected by heart disease.   Stress I: Signs and Symptoms -Discuss the causes of stress, how stress may lead to anxiety and depression, and ways to limit stress.   Stress II: Relaxation -Discuss different types of relaxation techniques to limit stress.   Warning Signs of Stroke / TIA -Discuss definition of a stroke, what the signs and symptoms are of a stroke, and how to identify when someone is having stroke.   Knowledge Questionnaire Score:  Knowledge Questionnaire Score - 12/28/20 1043       Knowledge Questionnaire Score   Pre Score 24/28           Core Components/Risk Factors/Patient Goals at Admission:  Personal Goals and Risk Factors at Admission - 12/28/20 1045      Core Components/Risk Factors/Patient Goals on Admission    Weight Management Weight Maintenance    Diabetes Yes    Intervention Provide education about signs/symptoms and action to take for hypo/hyperglycemia.;Provide education about proper nutrition, including hydration, and aerobic/resistive exercise prescription along with prescribed medications to achieve blood glucose in normal ranges: Fasting glucose 65-99 mg/dL    Expected Outcomes Short Term: Participant verbalizes understanding of the signs/symptoms and immediate care of hyper/hypoglycemia, proper  foot care and importance of medication, aerobic/resistive exercise and nutrition plan for blood glucose control.;Long Term: Attainment of HbA1C < 7%.    Personal Goal Other Yes    Personal Goal Learn about eating healthier; Get stronger; Get back to doing his ADL's; get healthier overall.    Intervention Provide education and monitored exercise for 12 weeks.    Expected Outcomes Patient will meet both program and personal goals.           Core Components/Risk Factors/Patient Goals Review:    Core Components/Risk Factors/Patient Goals at Discharge (Final Review):    ITP Comments:   Comments: Patient arrived for 1st visit/orientation/education at 0800. Patient was referred to CR by Daneen Schick, MD due to STEMI involving left circumflex coronary artery (I21.21) and S/P Coronary artery stent placement (Z95.5). During orientation advised patient on arrival and appointment times what to wear, what to do before, during and after exercise. Reviewed attendance and class policy.  Pt is scheduled to return Cardiac Rehab on 01/01/21 at 8:15. Pt was advised to come to class 15 minutes before class starts.  Discussed RPE/Dpysnea scales. Patient participated in warm up  stretches. Patient was able to complete 6 minute walk test.  Telemetry:NSR. Patient was measured for the equipment. Discussed equipment safety with patient. Took patient pre-anthropometric measurements. Patient finished visit at 1015.

## 2021-01-01 ENCOUNTER — Encounter (HOSPITAL_COMMUNITY)
Admission: RE | Admit: 2021-01-01 | Discharge: 2021-01-01 | Disposition: A | Discharge status: Home / Self Care | Payer: Medicare Other | Source: Ambulatory Visit | Attending: Interventional Cardiology | Admitting: Interventional Cardiology

## 2021-01-01 ENCOUNTER — Other Ambulatory Visit: Payer: Self-pay

## 2021-01-01 DIAGNOSIS — I2121 ST elevation (STEMI) myocardial infarction involving left circumflex coronary artery: Secondary | ICD-10-CM

## 2021-01-01 DIAGNOSIS — Z955 Presence of coronary angioplasty implant and graft: Secondary | ICD-10-CM

## 2021-01-01 NOTE — Progress Notes (Signed)
Daily Session Note  Patient Details  Name: SOCORRO EBRON MRN: [id number] Date of Birth: [birth year] Referring Provider:   Flowsheet Row CARDIAC REHAB PHASE II ORIENTATION from 12/28/2020 in [place]  Referring Provider Dr. Tamala Julian      Encounter Date: 01/01/2021  Check In:  Session Check In - 01/01/21 0815      Check-In   Supervising physician immediately available to respond to emergencies [facility] MD immediately available    Physician(s) Domenic Polite    Location [facility]    Staff Present Geanie Cooley, RN;Debra Wynetta Emery, RN, BSN;Madison Audria Nine, MS, Exercise Physiologist;Dalton Kris Mouton, MS, ACSM-CEP, Exercise Physiologist    Virtual Visit No    Medication changes reported     No    Fall or balance concerns reported    No    Tobacco Cessation No Change    Warm-up and Cool-down Performed as group-led instruction    Resistance Training Performed Yes    VAD Patient? No    PAD/SET Patient? No      Pain Assessment   Currently in Pain? No/denies    Pain Score 0-No pain    Multiple Pain Sites No           Capillary Blood Glucose: No results found for this or any previous visit (from the past 24 hour(s)).    Social History   Tobacco Use  Smoking Status Former Smoker  Smokeless Tobacco Never Used    Goals Met:  Independence with exercise equipment Exercise tolerated well No report of cardiac concerns or symptoms Strength training completed today  Goals Unmet:  Not Applicable  Comments: check out @         9:30   Dr. Kathie Dike is Medical Director for [facility] Pulmonary Rehab.

## 2021-01-03 ENCOUNTER — Other Ambulatory Visit: Payer: Self-pay

## 2021-01-03 ENCOUNTER — Encounter (HOSPITAL_COMMUNITY)
Admission: RE | Admit: 2021-01-03 | Discharge: 2021-01-03 | Disposition: A | Discharge status: Home / Self Care | Payer: Medicare Other | Source: Ambulatory Visit | Attending: Interventional Cardiology | Admitting: Interventional Cardiology

## 2021-01-03 DIAGNOSIS — I2121 ST elevation (STEMI) myocardial infarction involving left circumflex coronary artery: Secondary | ICD-10-CM | POA: Insufficient documentation

## 2021-01-03 DIAGNOSIS — Z955 Presence of coronary angioplasty implant and graft: Secondary | ICD-10-CM | POA: Insufficient documentation

## 2021-01-03 NOTE — Progress Notes (Signed)
Daily Session Note  Patient Details  Name: Austin Tucker MRN: [id number] Date of Birth: [birth year] Referring Provider:   Flowsheet Row CARDIAC REHAB PHASE II ORIENTATION from 12/28/2020 in [place]  Referring Provider Dr. Tamala Julian      Encounter Date: 01/03/2021  Check In:  Session Check In - 01/03/21 0815      Check-In   Supervising physician immediately available to respond to emergencies [facility] MD immediately available    Physician(s) Domenic Polite    Location [facility]    Staff Present Cathren Harsh, MS, Exercise Physiologist;Dalton Kris Mouton, MS, ACSM-CEP, Exercise Physiologist    Virtual Visit No    Medication changes reported     No    Fall or balance concerns reported    No    Tobacco Cessation No Change    Warm-up and Cool-down Performed as group-led instruction    Resistance Training Performed Yes    VAD Patient? No    PAD/SET Patient? No      Pain Assessment   Currently in Pain? No/denies    Pain Score 0-No pain    Multiple Pain Sites No           Capillary Blood Glucose: No results found for this or any previous visit (from the past 24 hour(s)).    Social History   Tobacco Use  Smoking Status Former Smoker  Smokeless Tobacco Never Used    Goals Met:  Independence with exercise equipment Exercise tolerated well No report of cardiac concerns or symptoms Strength training completed today  Goals Unmet:  Not Applicable  Comments: check out 0915   Dr. Kathie Dike is Medical Director for [facility] Pulmonary Rehab.

## 2021-01-05 ENCOUNTER — Telehealth: Payer: Self-pay | Admitting: Interventional Cardiology

## 2021-01-05 ENCOUNTER — Other Ambulatory Visit: Payer: Self-pay

## 2021-01-05 ENCOUNTER — Encounter (HOSPITAL_COMMUNITY)
Admission: RE | Admit: 2021-01-05 | Discharge: 2021-01-05 | Disposition: A | Discharge status: Home / Self Care | Payer: Medicare Other | Source: Ambulatory Visit | Attending: Interventional Cardiology | Admitting: Interventional Cardiology

## 2021-01-05 DIAGNOSIS — I2121 ST elevation (STEMI) myocardial infarction involving left circumflex coronary artery: Secondary | ICD-10-CM

## 2021-01-05 DIAGNOSIS — Z23 Encounter for immunization: Secondary | ICD-10-CM | POA: Diagnosis not present

## 2021-01-05 DIAGNOSIS — Z955 Presence of coronary angioplasty implant and graft: Secondary | ICD-10-CM

## 2021-01-05 NOTE — Telephone Encounter (Signed)
Patient calling in to see if he can get his COVID booster since he had a stent placed by Dr. Tamala Julian in December. Advised patient that from our standpoint, there is no contraindications but he could follow up with his primary cardiologist [facility]).   Patient states he prefers Dr. Tamala Julian because he "is the man".  He would like Dr. Tamala Julian to call him at his earliest convenience to discuss questions he has about his stent and other concerns he states have not be addressed by his primary cardiologist.   Will route to Dr. Tamala Julian

## 2021-01-05 NOTE — Telephone Encounter (Signed)
Patient had stents placed in December by Dr. Tamala Julian. Patient wants to know if it is safe for him to get his COVID booster shot today. Please advise

## 2021-01-05 NOTE — Progress Notes (Signed)
Daily Session Note  Patient Details  Name: Austin Tucker MRN: [id number] Date of Birth: [birth year] Referring Provider:   Flowsheet Row CARDIAC REHAB PHASE II ORIENTATION from 12/28/2020 in [place]  Referring Provider Dr. Tamala Julian      Encounter Date: 01/05/2021  Check In:  Session Check In - 01/05/21 0815      Check-In   Supervising physician immediately available to respond to emergencies [facility] MD immediately available    Physician(s) Dr. Harl Bowie    Location [facility]    Staff Present Cathren Harsh, MS, Exercise Physiologist;Shariq Puig Kris Mouton, MS, ACSM-CEP, Exercise Physiologist    Virtual Visit No    Medication changes reported     No    Fall or balance concerns reported    No    Tobacco Cessation No Change    Warm-up and Cool-down Performed as group-led instruction    Resistance Training Performed Yes    VAD Patient? No    PAD/SET Patient? No      Pain Assessment   Currently in Pain? No/denies    Pain Score 0-No pain    Multiple Pain Sites No           Capillary Blood Glucose: No results found for this or any previous visit (from the past 24 hour(s)).    Social History   Tobacco Use  Smoking Status Former Smoker  Smokeless Tobacco Never Used    Goals Met:  Independence with exercise equipment Exercise tolerated well No report of cardiac concerns or symptoms Strength training completed today  Goals Unmet:  Not Applicable  Comments: checkout time is 0915   Dr. Kathie Dike is Medical Director for [facility] Pulmonary Rehab.

## 2021-01-08 ENCOUNTER — Other Ambulatory Visit: Payer: Self-pay

## 2021-01-08 ENCOUNTER — Encounter (HOSPITAL_COMMUNITY)
Admission: RE | Admit: 2021-01-08 | Discharge: 2021-01-08 | Disposition: A | Discharge status: Home / Self Care | Payer: Medicare Other | Source: Ambulatory Visit | Attending: Interventional Cardiology | Admitting: Interventional Cardiology

## 2021-01-08 VITALS — Wt 130.5 lb

## 2021-01-08 DIAGNOSIS — Z955 Presence of coronary angioplasty implant and graft: Secondary | ICD-10-CM | POA: Diagnosis not present

## 2021-01-08 DIAGNOSIS — I2121 ST elevation (STEMI) myocardial infarction involving left circumflex coronary artery: Secondary | ICD-10-CM

## 2021-01-08 DIAGNOSIS — L409 Psoriasis, unspecified: Secondary | ICD-10-CM | POA: Diagnosis not present

## 2021-01-08 DIAGNOSIS — Z299 Encounter for prophylactic measures, unspecified: Secondary | ICD-10-CM | POA: Diagnosis not present

## 2021-01-08 DIAGNOSIS — Z87891 Personal history of nicotine dependence: Secondary | ICD-10-CM | POA: Diagnosis not present

## 2021-01-08 DIAGNOSIS — I2510 Atherosclerotic heart disease of native coronary artery without angina pectoris: Secondary | ICD-10-CM | POA: Diagnosis not present

## 2021-01-08 DIAGNOSIS — I10 Essential (primary) hypertension: Secondary | ICD-10-CM | POA: Diagnosis not present

## 2021-01-08 DIAGNOSIS — Z6824 Body mass index (BMI) 24.0-24.9, adult: Secondary | ICD-10-CM | POA: Diagnosis not present

## 2021-01-08 DIAGNOSIS — E1165 Type 2 diabetes mellitus with hyperglycemia: Secondary | ICD-10-CM | POA: Diagnosis not present

## 2021-01-08 NOTE — Progress Notes (Signed)
Daily Session Note  Patient Details  Name: Austin Tucker MRN: [id number] Date of Birth: [birth year] Referring Provider:   Flowsheet Row CARDIAC REHAB PHASE II ORIENTATION from 12/28/2020 in [place]  Referring Provider Dr. Tamala Julian      Encounter Date: 01/08/2021  Check In:  Session Check In - 01/08/21 0815      Check-In   Supervising physician immediately available to respond to emergencies [facility] MD immediately available    Physician(s) Dr. branch    Staff Present Cathren Harsh, MS, Exercise Physiologist;Dalton Kris Mouton, MS, ACSM-CEP, Exercise Physiologist    Virtual Visit No    Medication changes reported     No    Fall or balance concerns reported    No    Tobacco Cessation No Change    Warm-up and Cool-down Performed as group-led instruction    Resistance Training Performed Yes    VAD Patient? No    PAD/SET Patient? No      Pain Assessment   Currently in Pain? No/denies    Pain Score 0-No pain    Multiple Pain Sites No           Capillary Blood Glucose: No results found for this or any previous visit (from the past 24 hour(s)).    Social History   Tobacco Use  Smoking Status Former Smoker  Smokeless Tobacco Never Used    Goals Met:  Independence with exercise equipment Exercise tolerated well No report of cardiac concerns or symptoms Strength training completed today  Goals Unmet:  Not Applicable  Comments: check out 0915   Dr. Kathie Dike is Medical Director for [facility].

## 2021-01-10 ENCOUNTER — Other Ambulatory Visit: Payer: Self-pay

## 2021-01-10 ENCOUNTER — Encounter (HOSPITAL_COMMUNITY)
Admission: RE | Admit: 2021-01-10 | Discharge: 2021-01-10 | Disposition: A | Discharge status: Home / Self Care | Payer: Medicare Other | Source: Ambulatory Visit | Attending: Interventional Cardiology | Admitting: Interventional Cardiology

## 2021-01-10 DIAGNOSIS — L400 Psoriasis vulgaris: Secondary | ICD-10-CM | POA: Diagnosis not present

## 2021-01-10 DIAGNOSIS — I2121 ST elevation (STEMI) myocardial infarction involving left circumflex coronary artery: Secondary | ICD-10-CM | POA: Diagnosis not present

## 2021-01-10 DIAGNOSIS — Z955 Presence of coronary angioplasty implant and graft: Secondary | ICD-10-CM

## 2021-01-10 DIAGNOSIS — Z79899 Other long term (current) drug therapy: Secondary | ICD-10-CM | POA: Diagnosis not present

## 2021-01-10 NOTE — Progress Notes (Signed)
Daily Session Note  Patient Details  Name: Austin Tucker MRN: [id number] Date of Birth: 1956-05-11 Referring Provider:   Flowsheet Row CARDIAC REHAB PHASE II ORIENTATION from 12/28/2020 in [place]  Referring Provider Dr. Tamala Julian      Encounter Date: 01/10/2021  Check In:  Session Check In - 01/10/21 0815      Check-In   Supervising physician immediately available to respond to emergencies [facility] MD immediately available    Physician(s) Dr. Harl Bowie    Location [facility]    Staff Present Cathren Harsh, MS, Exercise Physiologist;Nicolis Boody Kris Mouton, MS, ACSM-CEP, Exercise Physiologist    Virtual Visit No    Medication changes reported     No    Fall or balance concerns reported    No    Tobacco Cessation No Change    Warm-up and Cool-down Performed as group-led instruction    Resistance Training Performed Yes    VAD Patient? No    PAD/SET Patient? No      Pain Assessment   Currently in Pain? No/denies    Pain Score 0-No pain    Multiple Pain Sites No           Capillary Blood Glucose: No results found for this or any previous visit (from the past 24 hour(s)).    Social History   Tobacco Use  Smoking Status Former Smoker  Smokeless Tobacco Never Used    Goals Met:  Independence with exercise equipment Exercise tolerated well No report of cardiac concerns or symptoms Strength training completed today  Goals Unmet:  Not Applicable  Comments: checkout time is 0915   Dr. Kathie Dike is Medical Director for [facility].

## 2021-01-10 NOTE — Progress Notes (Signed)
Cardiac Individual Treatment Plan  Patient Details  Name: Austin Tucker MRN: [id number] Date of Birth: May 07, 1956 Referring Provider:   Flowsheet Row CARDIAC REHAB PHASE II ORIENTATION from 12/28/2020 in [place]  Referring Provider Dr. 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  aspirin 81 MG chewable tablet, Chew 1 tablet (81 mg total) by mouth daily., Disp: 90 tablet, Rfl: 3 .  atorvastatin (LIPITOR) 40 MG tablet, Take 1 tablet (40 mg total) by mouth daily., Disp: 90 tablet, Rfl: 1 .  losartan (COZAAR) 25 MG tablet, Take 25 mg by mouth daily., Disp: , Rfl:  .  metFORMIN (GLUCOPHAGE-XR) 500 MG 24 hr tablet, Take 500 mg by mouth daily with breakfast., Disp: , Rfl:  .  metoprolol tartrate (LOPRESSOR) 25 MG tablet, Take 1 tablet (25 mg total) by mouth 2 (two) times daily., Disp: 60 tablet, Rfl: 4 .  nitroGLYCERIN (NITROSTAT) 0.4 MG SL tablet, Place 1 tablet (0.4 mg total) under the tongue every 5 (five) minutes as needed for chest pain., Disp: 25 tablet, Rfl: 1 .  ticagrelor (BRILINTA) 90 MG TABS tablet, Take 1 tablet (90 mg total) by mouth 2 (two) times daily., Disp: 180 tablet, Rfl: 3  Past Medical History: No past medical history on file.  Tobacco Use: Social History   Tobacco Use  Smoking Status Former Smoker  Smokeless Tobacco Never Used    Labs: Recent [employer] for ITP Cardiac and Pulmonary Rehab Latest Ref Rng & Units 11/29/2020 11/29/2020 11/29/2020 11/30/2020   Cholestrol 0 - 200 mg/dL 127 - 118 -   LDLCALC 0 - 99 mg/dL 79 - 56 -   HDL >40 mg/dL 24(L) - 21(L) -   Trlycerides <150 mg/dL 119  - 206(H) -   Hemoglobin A1c 4.8 - 5.6 % 7.8(H) - - 8.0(H)   TCO2 22 - 32 mmol/L - 20(L) - -      Capillary Blood Glucose: Lab Results  Component Value Date   GLUCAP 153 (H) 12/01/2020   GLUCAP 152 (H) 12/01/2020   GLUCAP 132 (H) 11/30/2020   GLUCAP 187 (H) 11/30/2020   GLUCAP 139 (H) 11/30/2020     Exercise Target Goals: Exercise Program Goal: Individual exercise prescription set using results from initial 6 min walk test and THRR while considering  patient's activity barriers and safety.   Exercise Prescription Goal: Starting with aerobic activity 30 plus minutes a day, 3 days per week for initial exercise prescription. Provide home exercise prescription and guidelines that participant acknowledges understanding prior to discharge.  Activity Barriers & Risk Stratification:  Activity Barriers & Cardiac Risk Stratification - 12/28/20 0922      Activity Barriers & Cardiac Risk Stratification   Activity Barriers None    Cardiac Risk Stratification High           6 Minute Walk:  6 Minute Walk    Row Name 12/28/20 1018         6 Minute Walk   Phase Initial     Distance 1400 feet     Walk Time 6 minutes     # of Rest Breaks 0     MPH  2.7     METS 3.18     RPE 8     VO2 Peak 11.15     Symptoms No     Resting HR 76 bpm     Resting BP 102/58     Resting Oxygen Saturation  99 %     Exercise Oxygen Saturation  during 6 min walk 100 %     Max Ex. HR 80 bpm     Max Ex. BP 112/62     2 Minute Post BP 110/60            Oxygen Initial Assessment:   Oxygen Re-Evaluation:   Oxygen Discharge (Final Oxygen Re-Evaluation):   Initial Exercise Prescription:  Initial Exercise Prescription - 12/28/20 1000      Date of Initial Exercise RX and Referring Provider   Date 12/28/20    Referring Provider Dr. Tamala Julian    Expected Discharge Date 03/16/21      NuStep   Level 1    SPM 60    Minutes 39      Intensity   THRR 40-80% of Max Heartrate 62-125    Ratings of  Perceived Exertion 11-15      Resistance Training   Training Prescription Yes    Weight 2    Reps 10-15           Perform Capillary Blood Glucose checks as needed.  Exercise Prescription Changes:   Exercise Prescription Changes    Row Name 01/08/21 1000             Response to Exercise   Blood Pressure (Admit) 116/68       Blood Pressure (Exercise) 138/72       Blood Pressure (Exit) 108/62       Heart Rate (Admit) 83 bpm       Heart Rate (Exercise) 98 bpm       Heart Rate (Exit) 90 bpm       Rating of Perceived Exertion (Exercise) 13       Duration Continue with 30 min of aerobic exercise without signs/symptoms of physical distress.       Intensity THRR unchanged               Progression   Progression Continue to progress workloads to maintain intensity without signs/symptoms of physical distress.               Resistance Training   Training Prescription Yes       Weight 2 lbs       Reps 10-15       Time 10 Minutes               NuStep   Level 1       SPM 89       Minutes 39       METs 2              Exercise Comments:   Exercise Goals and Review:   Exercise Goals    Row Name 12/28/20 1020 01/08/21 1038           Exercise Goals   Increase Physical Activity Yes Yes      Intervention Provide advice, education, support and counseling about physical activity/exercise needs.;Develop an individualized exercise prescription for aerobic and resistive training based on initial evaluation findings, risk stratification, comorbidities and participant's personal goals. Provide advice, education, support and counseling about physical activity/exercise needs.;Develop an individualized exercise prescription for aerobic and resistive training  based on initial evaluation findings, risk stratification, comorbidities and participant's personal goals.      Expected Outcomes Short Term: Attend rehab on a regular basis to increase amount of physical activity.;Long Term:  Add in home exercise to make exercise part of routine and to increase amount of physical activity.;Long Term: Exercising regularly at least 3-5 days a week. Short Term: Attend rehab on a regular basis to increase amount of physical activity.;Long Term: Add in home exercise to make exercise part of routine and to increase amount of physical activity.;Long Term: Exercising regularly at least 3-5 days a week.      Increase Strength and Stamina Yes Yes      Intervention Develop an individualized exercise prescription for aerobic and resistive training based on initial evaluation findings, risk stratification, comorbidities and participant's personal goals.;Provide advice, education, support and counseling about physical activity/exercise needs. Develop an individualized exercise prescription for aerobic and resistive training based on initial evaluation findings, risk stratification, comorbidities and participant's personal goals.;Provide advice, education, support and counseling about physical activity/exercise needs.      Expected Outcomes Short Term: Increase workloads from initial exercise prescription for resistance, speed, and METs.;Short Term: Perform resistance training exercises routinely during rehab and add in resistance training at home;Long Term: Improve cardiorespiratory fitness, muscular endurance and strength as measured by increased METs and functional capacity (6MWT) Short Term: Increase workloads from initial exercise prescription for resistance, speed, and METs.;Short Term: Perform resistance training exercises routinely during rehab and add in resistance training at home;Long Term: Improve cardiorespiratory fitness, muscular endurance and strength as measured by increased METs and functional capacity (6MWT)      Able to understand and use rate of perceived exertion (RPE) scale Yes Yes      Intervention Provide education and explanation on how to use RPE scale Provide education and explanation  on how to use RPE scale      Expected Outcomes Short Term: Able to use RPE daily in rehab to express subjective intensity level;Long Term:  Able to use RPE to guide intensity level when exercising independently Short Term: Able to use RPE daily in rehab to express subjective intensity level;Long Term:  Able to use RPE to guide intensity level when exercising independently      Knowledge and understanding of Target Heart Rate Range (THRR) Yes Yes      Intervention Provide education and explanation of THRR including how the numbers were predicted and where they are located for reference Provide education and explanation of THRR including how the numbers were predicted and where they are located for reference      Expected Outcomes Short Term: Able to state/look up THRR;Long Term: Able to use THRR to govern intensity when exercising independently;Short Term: Able to use daily as guideline for intensity in rehab Short Term: Able to state/look up THRR;Long Term: Able to use THRR to govern intensity when exercising independently;Short Term: Able to use daily as guideline for intensity in rehab      Able to check pulse independently Yes Yes      Intervention Provide education and demonstration on how to check pulse in carotid and radial arteries.;Review the importance of being able to check your own pulse for safety during independent exercise --      Expected Outcomes Short Term: Able to explain why pulse checking is important during independent exercise;Long Term: Able to check pulse independently and accurately Short Term: Able to explain why pulse checking is important during independent exercise;Long Term: Able  to check pulse independently and accurately      Understanding of Exercise Prescription Yes Yes      Intervention Provide education, explanation, and written materials on patient's individual exercise prescription Provide education, explanation, and written materials on patient's individual exercise  prescription      Expected Outcomes Short Term: Able to explain program exercise prescription;Long Term: Able to explain home exercise prescription to exercise independently Short Term: Able to explain program exercise prescription;Long Term: Able to explain home exercise prescription to exercise independently             Exercise Goals Re-Evaluation :  Exercise Goals Re-Evaluation    Row Name 01/08/21 1039             Exercise Goal Re-Evaluation   Exercise Goals Review Increase Physical Activity;Increase Strength and Stamina;Able to understand and use rate of perceived exertion (RPE) scale;Knowledge and understanding of Target Heart Rate Range (THRR);Able to check pulse independently;Understanding of Exercise Prescription       Comments Patient has completed 4 exercise sessions. He is tolerating exercise well and increasing his spm on the NuStep quickly. He is catching on to the program routine very well and continues to improve. He has a very positive attitute about rehab and enjoys coming. He is currently exercising at 2.0 METs on the NuStep. Will continue to progress as able.       Expected Outcomes Through exercise at rehab and a home exercise program , patient will achieve their goals.               Discharge Exercise Prescription (Final Exercise Prescription Changes):  Exercise Prescription Changes - 01/08/21 1000      Response to Exercise   Blood Pressure (Admit) 116/68    Blood Pressure (Exercise) 138/72    Blood Pressure (Exit) 108/62    Heart Rate (Admit) 83 bpm    Heart Rate (Exercise) 98 bpm    Heart Rate (Exit) 90 bpm    Rating of Perceived Exertion (Exercise) 13    Duration Continue with 30 min of aerobic exercise without signs/symptoms of physical distress.    Intensity THRR unchanged      Progression   Progression Continue to progress workloads to maintain intensity without signs/symptoms of physical distress.      Resistance Training   Training  Prescription Yes    Weight 2 lbs    Reps 10-15    Time 10 Minutes      NuStep   Level 1    SPM 89    Minutes 39    METs 2           Nutrition:  Target Goals: Understanding of nutrition guidelines, daily intake of sodium 1500mg , cholesterol 200mg , calories 30% from fat and 7% or less from saturated fats, daily to have 5 or more servings of fruits and vegetables.  Biometrics:  Pre Biometrics - 01/08/21 1041      Pre Biometrics   Weight 59.2 kg    BMI (Calculated) 23.86            Nutrition Therapy Plan and Nutrition Goals:  Nutrition Therapy & Goals - 01/01/21 1404      Personal Nutrition Goals   Comments We will continue to provide heart health nutritional education through handouts.      Intervention Plan   Intervention Nutrition handout(s) given to patient.           Nutrition Assessments:  Nutrition Assessments - 12/28/20 1042  MEDFICTS Scores   Pre Score 33          MEDIFICTS Score Key:  ?70 Need to make dietary changes   40-70 Heart Healthy Diet  ? 40 Therapeutic Level Cholesterol Diet   Picture Your Plate Scores:  <84 Unhealthy dietary pattern with much room for improvement.  41-50 Dietary pattern unlikely to meet recommendations for good health and room for improvement.  51-60 More healthful dietary pattern, with some room for improvement.   >60 Healthy dietary pattern, although there may be some specific behaviors that could be improved.    Nutrition Goals Re-Evaluation:   Nutrition Goals Discharge (Final Nutrition Goals Re-Evaluation):   Psychosocial: Target Goals: Acknowledge presence or absence of significant depression and/or stress, maximize coping skills, provide positive support system. Participant is able to verbalize types and ability to use techniques and skills needed for reducing stress and depression.  Initial Review & Psychosocial Screening:  Initial Psych Review & Screening - 12/28/20 1033      Initial  Review   Current issues with None Identified      Family Dynamics   Good Support System? Yes    Comments Patient lives with his wife of many years. He has one daughter that lives in [place] that he is close with.  He says his wife is his support system and his chruch family. He denies any depression, stress, or anxiety. He says he is excited about starting cardiac rehab. He demonstrates a very positive outlook and attitude regarding his furture and health.      Barriers   Psychosocial barriers to participate in program Psychosocial barriers identified (see note)      Screening Interventions   Interventions Encouraged to exercise;Program counselor consult    Expected Outcomes Short Term goal: Identification and review with participant of any Quality of Life or Depression concerns found by scoring the questionnaire.           Quality of Life Scores:  Quality of Life - 12/28/20 1022      Quality of Life   Select Quality of Life      Quality of Life Scores   Health/Function Pre 18.8 %    Socioeconomic Pre 22.58 %    Psych/Spiritual Pre 21.79 %    Family Pre 19.8 %    GLOBAL Pre 20.27 %          Scores of 19 and below usually indicate a poorer quality of life in these areas.  A difference of  2-3 points is a clinically meaningful difference.  A difference of 2-3 points in the total score of the Quality of Life Index has been associated with significant improvement in overall quality of life, self-image, physical symptoms, and general health in studies assessing change in quality of life.  PHQ-9: Recent Review Flowsheet Data    Depression screen [facility] 2/9 12/28/2020   Decreased Interest 0   Down, Depressed, Hopeless 0   PHQ - 2 Score 0   Altered sleeping 1    Tired, decreased energy 0   Change in appetite 0   Feeling bad or failure about yourself  0   Trouble concentrating 0   Moving slowly or fidgety/restless 0   Suicidal thoughts 0   PHQ-9 Score 1   Difficult doing  work/chores Not difficult at all     Interpretation of Total Score  Total Score Depression Severity:  1-4 = Minimal depression, 5-9 = Mild depression, 10-14 = Moderate depression, 15-19 = Moderately severe  depression, 20-27 = Severe depression   Psychosocial Evaluation and Intervention:  Psychosocial Evaluation - 12/28/20 1036      Psychosocial Evaluation & Interventions   Interventions Stress management education;Relaxation education;Encouraged to exercise with the program and follow exercise prescription    Comments Patient has no psychosocial issues identified at his orientation visit. His QOL score was 20.27 overall scoring lowest in health/function. His PHQ-9 socre was 1. Will continue to monitor.    Expected Outcomes Patient will have no psychosocial issues identified at discharge.    Continue Psychosocial Services  No Follow up required           Psychosocial Re-Evaluation:  Psychosocial Re-Evaluation    [place] Name 01/01/21 1402             Psychosocial Re-Evaluation   Current issues with None Identified       Comments Patient is new to the program completing 2 sessions. He has no psychosocial issues identified. He is excited to start the program and looks forward to exercising. We will continue to monitor.       Expected Outcomes Patient will have no psychosocial issues identified at discharge.       Interventions Stress management education;Encouraged to attend Cardiac Rehabilitation for the exercise;Relaxation education       Continue Psychosocial Services  No Follow up required              Psychosocial Discharge (Final Psychosocial Re-Evaluation):  Psychosocial Re-Evaluation - 01/01/21 1402      Psychosocial Re-Evaluation   Current issues with None Identified    Comments Patient is new to the program completing 2 sessions. He has no psychosocial issues identified. He is excited to start the program and looks forward to exercising. We will continue to monitor.     Expected Outcomes Patient will have no psychosocial issues identified at discharge.    Interventions Stress management education;Encouraged to attend Cardiac Rehabilitation for the exercise;Relaxation education    Continue Psychosocial Services  No Follow up required           Vocational Rehabilitation: Provide vocational rehab assistance to qualifying candidates.   Vocational Rehab Evaluation & Intervention:  Vocational Rehab - 12/28/20 1043      Initial Vocational Rehab Evaluation & Intervention   Assessment shows need for Vocational Rehabilitation No      Vocational Rehab Re-Evaulation   Comments Patient is retired and does not need vocational rehab.           Education: Education Goals: Education classes will be provided on a weekly basis, covering required topics. Participant will state understanding/return demonstration of topics presented.  Learning Barriers/Preferences:  Learning Barriers/Preferences - 12/28/20 1042      Learning Barriers/Preferences   Learning Barriers None    Learning Preferences Video;Written Material;Audio           Education Topics: Hypertension, Hypertension Reduction -Define heart disease and high blood pressure. Discus how high blood pressure affects the body and ways to reduce high blood pressure.   Exercise and Your Heart -Discuss why it is important to exercise, the FITT principles of exercise, normal and abnormal responses to exercise, and how to exercise safely.   Angina -Discuss definition of angina, causes of angina, treatment of angina, and how to decrease risk of having angina.   Cardiac Medications -Review what the following cardiac medications are used for, how they affect the body, and side effects that may occur when taking the medications.  Medications include Aspirin, Beta blockers,  calcium channel blockers, ACE Inhibitors, angiotensin receptor blockers, diuretics, digoxin, and antihyperlipidemics.   Congestive  Heart Failure -Discuss the definition of CHF, how to live with CHF, the signs and symptoms of CHF, and how keep track of weight and sodium intake. Flowsheet Row CARDIAC REHAB PHASE II EXERCISE from 01/03/2021 in [place]  Date 01/03/21  Educator Mk  Instruction Review Code 2- Demonstrated Understanding      Heart Disease and Intimacy -Discus the effect sexual activity has on the heart, how changes occur during intimacy as we age, and safety during sexual activity.   Smoking Cessation / COPD -Discuss different methods to quit smoking, the health benefits of quitting smoking, and the definition of COPD.   Nutrition I: Fats -Discuss the types of cholesterol, what cholesterol does to the heart, and how cholesterol levels can be controlled.   Nutrition II: Labels -Discuss the different components of food labels and how to read food label   Heart Parts/Heart Disease and PAD -Discuss the anatomy of the heart, the pathway of blood circulation through the heart, and these are affected by heart disease.   Stress I: Signs and Symptoms -Discuss the causes of stress, how stress may lead to anxiety and depression, and ways to limit stress.   Stress II: Relaxation -Discuss different types of relaxation techniques to limit stress.   Warning Signs of Stroke / TIA -Discuss definition of a stroke, what the signs and symptoms are of a stroke, and how to identify when someone is having stroke.   Knowledge Questionnaire Score:  Knowledge Questionnaire Score - 12/28/20 1043      Knowledge Questionnaire Score   Pre Score 24/28           Core Components/Risk Factors/Patient Goals at Admission:  Personal Goals and Risk Factors at Admission - 12/28/20 1045      Core Components/Risk Factors/Patient Goals on Admission    Weight Management Weight Maintenance    Diabetes Yes    Intervention Provide education about signs/symptoms and action to take for  hypo/hyperglycemia.;Provide education about proper nutrition, including hydration, and aerobic/resistive exercise prescription along with prescribed medications to achieve blood glucose in normal ranges: Fasting glucose 65-99 mg/dL    Expected Outcomes Short Term: Participant verbalizes understanding of the signs/symptoms and immediate care of hyper/hypoglycemia, proper foot care and importance of medication, aerobic/resistive exercise and nutrition plan for blood glucose control.;Long Term: Attainment of HbA1C < 7%.    Personal Goal Other Yes    Personal Goal Learn about eating healthier; Get stronger; Get back to doing his ADL's; get healthier overall.    Intervention Provide education and monitored exercise for 12 weeks.    Expected Outcomes Patient will meet both program and personal goals.           Core Components/Risk Factors/Patient Goals Review:   Goals and Risk Factor Review    Row Name 01/01/21 1405             Core Components/Risk Factors/Patient Goals Review   Personal Goals Review Weight Management/Obesity;Other;Diabetes       Review Patient is new to the program completing 2 sessions. He was referred to cardiac rebab with [facility]. He has multiple risk factors for CAD and is doing the program for risk modification. His last A1C on file was 8.0% on 11/30/20. He is a newly diagnosed diabetic using Metformin and diet for control. His personal goals for the program are to get stronger; be able to return to his ADL's  and to learn more about healthy eating and get health overall. We will continue to monitor for progress as he works toward meeting these goals.       Expected Outcomes Patient will complete the program meeting both personal and program goal.s              Core Components/Risk Factors/Patient Goals at Discharge (Final Review):   Goals and Risk Factor Review - 01/01/21 1405      Core Components/Risk Factors/Patient Goals Review   Personal Goals Review Weight  Management/Obesity;Other;Diabetes    Review Patient is new to the program completing 2 sessions. He was referred to cardiac rebab with [facility]. He has multiple risk factors for CAD and is doing the program for risk modification. His last A1C on file was 8.0% on 11/30/20. He is a newly diagnosed diabetic using Metformin and diet for control. His personal goals for the program are to get stronger; be able to return to his ADL's and to learn more about healthy eating and get health overall. We will continue to monitor for progress as he works toward meeting these goals.    Expected Outcomes Patient will complete the program meeting both personal and program goal.s           ITP Comments:   Comments: ITP REVIEW Pt is making expected progress toward Cardiac Rehab goals after completing 5 sessions. Recommend continued exercise, life style modification, education, and increased stamina and strength.

## 2021-01-12 ENCOUNTER — Other Ambulatory Visit: Payer: Self-pay

## 2021-01-12 ENCOUNTER — Encounter (HOSPITAL_COMMUNITY)
Admission: RE | Admit: 2021-01-12 | Discharge: 2021-01-12 | Disposition: A | Discharge status: Home / Self Care | Payer: Medicare Other | Source: Ambulatory Visit | Attending: Interventional Cardiology | Admitting: Interventional Cardiology

## 2021-01-12 DIAGNOSIS — Z955 Presence of coronary angioplasty implant and graft: Secondary | ICD-10-CM

## 2021-01-12 DIAGNOSIS — I2121 ST elevation (STEMI) myocardial infarction involving left circumflex coronary artery: Secondary | ICD-10-CM | POA: Diagnosis not present

## 2021-01-12 NOTE — Progress Notes (Signed)
I have reviewed a Home Exercise Prescription with [name] . Austin Tucker is not currently exercising at home.  The patient was advised to walk 2 days a week for 20 minutes outside of rehab.  Bartolo and I discussed how to progress their exercise prescription.  The patient stated that their goals were to get stronger.  The patient stated that they understand the exercise prescription.  We reviewed exercise guidelines, target heart rate during exercise, RPE Scale, weather conditions, NTG use, endpoints for exercise, warmup and cool down.  Patient is encouraged to come to me with any questions. I will continue to follow up with the patient to assist them with progression and safety.

## 2021-01-12 NOTE — Progress Notes (Signed)
Daily Session Note  Patient Details  Name: Austin Tucker MRN: [id number] Date of Birth: 1956-02-15 Referring Provider:   Flowsheet Row CARDIAC REHAB PHASE II ORIENTATION from 12/28/2020 in [place]  Referring Provider Dr. Tamala Julian      Encounter Date: 01/12/2021  Check In:  Session Check In - 01/12/21 0815      Check-In   Supervising physician immediately available to respond to emergencies [facility] MD immediately available    Physician(s) Dr. Melene Muller    Location [facility]    Staff Present Cathren Harsh, MS, Exercise Physiologist;Dalton Kris Mouton, MS, ACSM-CEP, Exercise Physiologist    Virtual Visit No    Medication changes reported     No    Fall or balance concerns reported    No    Tobacco Cessation No Change    Warm-up and Cool-down Performed as group-led instruction    Resistance Training Performed Yes    VAD Patient? No    PAD/SET Patient? No      Pain Assessment   Currently in Pain? No/denies    Pain Score 0-No pain    Multiple Pain Sites No           Capillary Blood Glucose: No results found for this or any previous visit (from the past 24 hour(s)).    Social History   Tobacco Use  Smoking Status Former Smoker  Smokeless Tobacco Never Used    Goals Met:  Independence with exercise equipment Exercise tolerated well No report of cardiac concerns or symptoms Strength training completed today  Goals Unmet:  Not Applicable  Comments: check out 0915   Dr. Kathie Dike is Medical Director for [facility].

## 2021-01-14 NOTE — Telephone Encounter (Signed)
Okay to get booster.

## 2021-01-15 ENCOUNTER — Encounter (HOSPITAL_COMMUNITY)
Admission: RE | Admit: 2021-01-15 | Discharge: 2021-01-15 | Disposition: A | Discharge status: Home / Self Care | Payer: Medicare Other | Source: Ambulatory Visit | Attending: Interventional Cardiology | Admitting: Interventional Cardiology

## 2021-01-15 ENCOUNTER — Other Ambulatory Visit: Payer: Self-pay

## 2021-01-15 DIAGNOSIS — Z955 Presence of coronary angioplasty implant and graft: Secondary | ICD-10-CM | POA: Diagnosis not present

## 2021-01-15 DIAGNOSIS — I2121 ST elevation (STEMI) myocardial infarction involving left circumflex coronary artery: Secondary | ICD-10-CM

## 2021-01-15 NOTE — Telephone Encounter (Signed)
Left message to call back  

## 2021-01-15 NOTE — Telephone Encounter (Signed)
Spoke with pt and made him aware ok to get covid booster.  Pt mentioned that he occasionally has a slight pain on the right side of his chest.  Advised pt to speak with his cardiologist at the [facility] clinic.  Pt appreciative for call.

## 2021-01-15 NOTE — Progress Notes (Signed)
Daily Session Note  Patient Details  Name: Austin Tucker MRN: [id number] Date of Birth: December 19, 1955 Referring Provider:   Flowsheet Row CARDIAC REHAB PHASE II ORIENTATION from 12/28/2020 in [place]  Referring Provider Dr. Tamala Julian      Encounter Date: 01/15/2021  Check In:  Session Check In - 01/15/21 0815      Check-In   Supervising physician immediately available to respond to emergencies [facility] immediately available    Physician(s) Dr. Johnsie Cancel    Location [facility]    Staff Present Cathren Harsh, MS, Exercise Physiologist;Parker Sawatzky Kris Mouton, MS, ACSM-CEP, Exercise Physiologist    Virtual Visit No    Medication changes reported     No    Fall or balance concerns reported    No    Tobacco Cessation No Change    Warm-up and Cool-down Performed as group-led instruction    Resistance Training Performed Yes    VAD Patient? No    PAD/SET Patient? No      Pain Assessment   Currently in Pain? No/denies    Pain Score 0-No pain    Multiple Pain Sites No           Capillary Blood Glucose: No results found for this or any previous visit (from the past 24 hour(s)).    Social History   Tobacco Use  Smoking Status Former Smoker  Smokeless Tobacco Never Used    Goals Met:  Independence with exercise equipment Exercise tolerated well No report of cardiac concerns or symptoms Strength training completed today  Goals Unmet:  Not Applicable  Comments: checkout time is 0915   Dr. Kathie Dike is Medical Director for [facility].

## 2021-01-17 ENCOUNTER — Encounter (HOSPITAL_COMMUNITY)
Admission: RE | Admit: 2021-01-17 | Discharge: 2021-01-17 | Disposition: A | Discharge status: Home / Self Care | Payer: Medicare Other | Source: Ambulatory Visit | Attending: Interventional Cardiology | Admitting: Interventional Cardiology

## 2021-01-17 ENCOUNTER — Other Ambulatory Visit: Payer: Self-pay

## 2021-01-17 ENCOUNTER — Encounter (HOSPITAL_COMMUNITY): Payer: Medicare Other

## 2021-01-17 DIAGNOSIS — I2121 ST elevation (STEMI) myocardial infarction involving left circumflex coronary artery: Secondary | ICD-10-CM

## 2021-01-17 DIAGNOSIS — Z955 Presence of coronary angioplasty implant and graft: Secondary | ICD-10-CM | POA: Diagnosis not present

## 2021-01-17 NOTE — Progress Notes (Signed)
Daily Session Note  Patient Details  Name: Austin Tucker MRN: [id number] Date of Birth: [birth year] Referring Provider:   Flowsheet Row CARDIAC REHAB PHASE II ORIENTATION from 12/28/2020 in [place]  Referring Provider Dr. Tamala Julian      Encounter Date: 01/17/2021  Check In:  Session Check In - 01/17/21 0815      Check-In   Supervising physician immediately available to respond to emergencies [facility] immediately available    Physician(s) Dr. Johnsie Cancel    Location [facility]    Staff Present Cathren Harsh, MS, Exercise Physiologist;Pearla Mckinny Kris Mouton, MS, ACSM-CEP, Exercise Physiologist    Virtual Visit No    Medication changes reported     No    Fall or balance concerns reported    No    Tobacco Cessation No Change    Warm-up and Cool-down Performed as group-led instruction    Resistance Training Performed Yes    VAD Patient? No    PAD/SET Patient? No      Pain Assessment   Currently in Pain? No/denies    Pain Score 0-No pain    Multiple Pain Sites No           Capillary Blood Glucose: No results found for this or any previous visit (from the past 24 hour(s)).    Social History   Tobacco Use  Smoking Status Former Smoker  Smokeless Tobacco Never Used    Goals Met:  Independence with exercise equipment Exercise tolerated well No report of cardiac concerns or symptoms Strength training completed today  Goals Unmet:  Not Applicable  Comments: checkout time is 0915   Dr. Kathie Dike is Medical Director for [facility] Pulmonary Re[facility].

## 2021-01-19 ENCOUNTER — Other Ambulatory Visit: Payer: Self-pay

## 2021-01-19 ENCOUNTER — Encounter (HOSPITAL_COMMUNITY)
Admission: RE | Admit: 2021-01-19 | Discharge: 2021-01-19 | Disposition: A | Discharge status: Home / Self Care | Payer: Medicare Other | Source: Ambulatory Visit | Attending: Interventional Cardiology | Admitting: Interventional Cardiology

## 2021-01-19 DIAGNOSIS — Z955 Presence of coronary angioplasty implant and graft: Secondary | ICD-10-CM | POA: Diagnosis not present

## 2021-01-19 DIAGNOSIS — I2121 ST elevation (STEMI) myocardial infarction involving left circumflex coronary artery: Secondary | ICD-10-CM | POA: Diagnosis not present

## 2021-01-19 NOTE — Progress Notes (Signed)
Daily Session Note  Patient Details  Name: Austin Tucker MRN: [id number] Date of Birth: [birth year] Referring Provider:   Flowsheet Row CARDIAC REHAB PHASE II ORIENTATION from 12/28/2020 in [place]  Referring Provider Dr. Tamala Julian      Encounter Date: 01/19/2021  Check In:  Session Check In - 01/19/21 0815      Check-In   Supervising physician immediately available to respond to emergencies [facility] immediately available    Physician(s) Dr. Johnsie Cancel    Location [facility]    Staff Present Cathren Harsh, MS, Exercise Physiologist;Zarielle Cea Kris Mouton, MS, ACSM-CEP, Exercise Physiologist    Virtual Visit No    Medication changes reported     No    Fall or balance concerns reported    No    Tobacco Cessation No Change    Warm-up and Cool-down Performed as group-led instruction    Resistance Training Performed Yes    VAD Patient? No    PAD/SET Patient? No      Pain Assessment   Currently in Pain? No/denies    Pain Score 0-No pain    Multiple Pain Sites No           Capillary Blood Glucose: No results found for this or any previous visit (from the past 24 hour(s)).    Social History   Tobacco Use  Smoking Status Former Smoker  Smokeless Tobacco Never Used    Goals Met:  Independence with exercise equipment Exercise tolerated well No report of cardiac concerns or symptoms Strength training completed today  Goals Unmet:  Not Applicable  Comments: checkout time is 0915   Dr. Kathie Dike is Medical Director for [facility] Pul[facility] Re[facility].

## 2021-01-22 ENCOUNTER — Encounter (HOSPITAL_COMMUNITY)
Admission: RE | Admit: 2021-01-22 | Discharge: 2021-01-22 | Disposition: A | Discharge status: Home / Self Care | Payer: Medicare Other | Source: Ambulatory Visit | Attending: Interventional Cardiology | Admitting: Interventional Cardiology

## 2021-01-22 ENCOUNTER — Other Ambulatory Visit: Payer: Self-pay

## 2021-01-22 VITALS — Wt 127.6 lb

## 2021-01-22 DIAGNOSIS — I2121 ST elevation (STEMI) myocardial infarction involving left circumflex coronary artery: Secondary | ICD-10-CM | POA: Diagnosis not present

## 2021-01-22 DIAGNOSIS — Z955 Presence of coronary angioplasty implant and graft: Secondary | ICD-10-CM

## 2021-01-22 NOTE — Progress Notes (Signed)
Daily Session Note  Patient Details  Name: Austin Tucker MRN: [id number] Date of Birth: 1956/12/01 Referring Provider:   Flowsheet Row CARDIAC REHAB PHASE II ORIENTATION from 12/28/2020 in [place]  Referring Provider Dr. Tamala Julian      Encounter Date: 01/22/2021  Check In:  Session Check In - 01/22/21 0815      Check-In   Supervising physician immediately available to respond to emergencies [facility] immediately available    Physician(s) Dr. Johnsie Cancel    Location [facility]    Staff Present Cathren Harsh, MS, Exercise Physiologist;Dalton Kris Mouton, MS, ACSM-CEP, Exercise Physiologist    Virtual Visit No    Medication changes reported     No    Fall or balance concerns reported    No    Tobacco Cessation No Change    Warm-up and Cool-down Performed as group-led instruction    Resistance Training Performed Yes    VAD Patient? No    PAD/SET Patient? No      Pain Assessment   Currently in Pain? No/denies    Pain Score 0-No pain    Multiple Pain Sites No           Capillary Blood Glucose: No results found for this or any previous visit (from the past 24 hour(s)).    Social History   Tobacco Use  Smoking Status Former Smoker  Smokeless Tobacco Never Used    Goals Met:  Independence with exercise equipment Exercise tolerated well No report of cardiac concerns or symptoms Strength training completed today  Goals Unmet:  Not Applicable  Comments: checkout 0915   Dr. Kathie Dike is Medical Director for [facility] Re[facility].

## 2021-01-23 ENCOUNTER — Encounter (INDEPENDENT_AMBULATORY_CARE_PROVIDER_SITE_OTHER): Payer: Self-pay

## 2021-01-24 ENCOUNTER — Encounter (HOSPITAL_COMMUNITY)
Admission: RE | Admit: 2021-01-24 | Discharge: 2021-01-24 | Disposition: A | Discharge status: Home / Self Care | Payer: Medicare Other | Source: Ambulatory Visit | Attending: Interventional Cardiology | Admitting: Interventional Cardiology

## 2021-01-24 ENCOUNTER — Other Ambulatory Visit: Payer: Self-pay

## 2021-01-24 DIAGNOSIS — Z955 Presence of coronary angioplasty implant and graft: Secondary | ICD-10-CM | POA: Diagnosis not present

## 2021-01-24 DIAGNOSIS — I2121 ST elevation (STEMI) myocardial infarction involving left circumflex coronary artery: Secondary | ICD-10-CM | POA: Diagnosis not present

## 2021-01-24 NOTE — Progress Notes (Signed)
Daily Session Note  Patient Details  Name: Austin Tucker MRN: [id number] Date of Birth: 1956-09-22 Referring Provider:   Flowsheet Row CARDIAC REHAB PHASE II ORIENTATION from 12/28/2020 in [place]  Referring Provider Dr. Tamala Julian      Encounter Date: 01/24/2021  Check In:  Session Check In - 01/24/21 0815      Check-In   Supervising physician immediately available to respond to emergencies [facility] immediately available    Physician(s) Dr. Domenic Polite    Location [facility]    Staff Present Aundra Dubin, RN, BSN;Marissa Lowrey Audria Nine, MS, Exercise Physiologist    Virtual Visit No    Medication changes reported     No    Fall or balance concerns reported    No    Tobacco Cessation No Change    Warm-up and Cool-down Performed as group-led instruction    Resistance Training Performed Yes    VAD Patient? No    PAD/SET Patient? No      Pain Assessment   Currently in Pain? No/denies    Pain Score 0-No pain    Multiple Pain Sites No           Capillary Blood Glucose: No results found for this or any previous visit (from the past 24 hour(s)).    Social History   Tobacco Use  Smoking Status Former Smoker  Smokeless Tobacco Never Used    Goals Met:  Independence with exercise equipment Exercise tolerated well No report of cardiac concerns or symptoms Strength training completed today  Goals Unmet:  Not Applicable  Comments: check out 0915   Dr. Kathie Dike is Medical Director for [facility] Pulmonary Re[facility].

## 2021-01-25 DIAGNOSIS — E1165 Type 2 diabetes mellitus with hyperglycemia: Secondary | ICD-10-CM | POA: Diagnosis not present

## 2021-01-26 ENCOUNTER — Other Ambulatory Visit: Payer: Self-pay

## 2021-01-26 ENCOUNTER — Encounter (HOSPITAL_COMMUNITY)
Admission: RE | Admit: 2021-01-26 | Discharge: 2021-01-26 | Disposition: A | Discharge status: Home / Self Care | Payer: Medicare Other | Source: Ambulatory Visit | Attending: Interventional Cardiology | Admitting: Interventional Cardiology

## 2021-01-26 DIAGNOSIS — I2121 ST elevation (STEMI) myocardial infarction involving left circumflex coronary artery: Secondary | ICD-10-CM | POA: Diagnosis not present

## 2021-01-26 DIAGNOSIS — Z955 Presence of coronary angioplasty implant and graft: Secondary | ICD-10-CM

## 2021-01-26 NOTE — Progress Notes (Signed)
Daily Session Note  Patient Details  Name: Austin Tucker MRN: [id number] Date of Birth: 05/17/1956 Referring Provider:   Flowsheet Row CARDIAC REHAB PHASE II ORIENTATION from 12/28/2020 in [place]  Referring Provider Dr. Tamala Julian      Encounter Date: 01/26/2021  Check In:  Session Check In - 01/26/21 0815      Check-In   Supervising physician immediately available to respond to emergencies [facility] immediately available    Physician(s) Dr. Domenic Polite    Location [facility]    Staff Present Cathren Harsh, MS, Exercise Physiologist;Dalton Kris Mouton, MS, ACSM-CEP, Exercise Physiologist    Virtual Visit No    Medication changes reported     No    Fall or balance concerns reported    No    Tobacco Cessation No Change    Warm-up and Cool-down Performed as group-led instruction    Resistance Training Performed Yes    VAD Patient? No    PAD/SET Patient? No      Pain Assessment   Currently in Pain? No/denies    Pain Score 0-No pain    Multiple Pain Sites No           Capillary Blood Glucose: No results found for this or any previous visit (from the past 24 hour(s)).    Social History   Tobacco Use  Smoking Status Former Smoker  Smokeless Tobacco Never Used    Goals Met:  Independence with exercise equipment Exercise tolerated well No report of cardiac concerns or symptoms Strength training completed today   Goals Unmet:  Not Applicable  Comments: check out 0915   Dr. Kathie Dike is Medical Director for [facility] Pulmonary Rehab.

## 2021-01-29 ENCOUNTER — Other Ambulatory Visit: Payer: Self-pay

## 2021-01-29 ENCOUNTER — Encounter (HOSPITAL_COMMUNITY)
Admission: RE | Admit: 2021-01-29 | Discharge: 2021-01-29 | Disposition: A | Discharge status: Home / Self Care | Payer: Medicare Other | Source: Ambulatory Visit | Attending: Interventional Cardiology | Admitting: Interventional Cardiology

## 2021-01-29 DIAGNOSIS — Z955 Presence of coronary angioplasty implant and graft: Secondary | ICD-10-CM | POA: Diagnosis not present

## 2021-01-29 DIAGNOSIS — E1165 Type 2 diabetes mellitus with hyperglycemia: Secondary | ICD-10-CM | POA: Diagnosis not present

## 2021-01-29 DIAGNOSIS — I2121 ST elevation (STEMI) myocardial infarction involving left circumflex coronary artery: Secondary | ICD-10-CM

## 2021-01-29 NOTE — Progress Notes (Signed)
Daily Session Note  Patient Details  Name: Austin Tucker MRN: [id number] Date of Birth: February 24, 1956 Referring Provider:   Flowsheet Row CARDIAC REHAB PHASE II ORIENTATION from 12/28/2020 in [place]  Referring Provider Dr. Tamala Julian      Encounter Date: 01/29/2021  Check In:  Session Check In - 01/29/21 0815      Check-In   Supervising physician immediately available to respond to emergencies [facility] immediately available    Physician(s) Dr. Harl Bowie    Location [facility]    Staff Present Cathren Harsh, MS, Exercise Physiologist;Dalton Kris Mouton, MS, ACSM-CEP, Exercise Physiologist    Virtual Visit No    Medication changes reported     No    Fall or balance concerns reported    No    Tobacco Cessation No Change    Warm-up and Cool-down Performed as group-led instruction    Resistance Training Performed Yes    VAD Patient? No    PAD/SET Patient? No      Pain Assessment   Currently in Pain? No/denies    Pain Score 0-No pain    Multiple Pain Sites No           Capillary Blood Glucose: No results found for this or any previous visit (from the past 24 hour(s)).    Social History   Tobacco Use  Smoking Status Former Smoker  Smokeless Tobacco Never Used    Goals Met:  Independence with exercise equipment Exercise tolerated well No report of cardiac concerns or symptoms Strength training completed today  Goals Unmet:  Not Applicable  Comments: check out 0915   Dr. Kathie Dike is Medical Director for [facility] Pulmonary Re[facility].

## 2021-01-31 ENCOUNTER — Other Ambulatory Visit: Payer: Self-pay

## 2021-01-31 ENCOUNTER — Encounter (HOSPITAL_COMMUNITY)
Admission: RE | Admit: 2021-01-31 | Discharge: 2021-01-31 | Disposition: A | Discharge status: Home / Self Care | Payer: Medicare Other | Source: Ambulatory Visit | Attending: Interventional Cardiology | Admitting: Interventional Cardiology

## 2021-01-31 DIAGNOSIS — I2121 ST elevation (STEMI) myocardial infarction involving left circumflex coronary artery: Secondary | ICD-10-CM | POA: Insufficient documentation

## 2021-01-31 DIAGNOSIS — Z955 Presence of coronary angioplasty implant and graft: Secondary | ICD-10-CM | POA: Insufficient documentation

## 2021-01-31 NOTE — Progress Notes (Signed)
Daily Session Note  Patient Details  Name: Austin Tucker MRN: [id number] Date of Birth: 12/05/1955 Referring Provider:   Flowsheet Row CARDIAC REHAB PHASE II ORIENTATION from 12/28/2020 in [place]  Referring Provider Dr. Tamala Julian      Encounter Date: 01/31/2021  Check In:  Session Check In - 01/31/21 0815      Check-In   Supervising physician immediately available to respond to emergencies [facility] MD immediately available    Physician(s) Dr. Harl Bowie    Location [facility]    Staff Present Cathren Harsh, MS, Exercise Physiologist;Dalton Kris Mouton, MS, ACSM-CEP, Exercise Physiologist    Virtual Visit No    Medication changes reported     No    Fall or balance concerns reported    No    Tobacco Cessation No Change    Warm-up and Cool-down Performed as group-led instruction    Resistance Training Performed Yes    VAD Patient? No    PAD/SET Patient? No      Pain Assessment   Currently in Pain? No/denies    Pain Score 0-No pain    Multiple Pain Sites No           Capillary Blood Glucose: No results found for this or any previous visit (from the past 24 hour(s)).    Social History   Tobacco Use  Smoking Status Former Smoker  Smokeless Tobacco Never Used    Goals Met:  Independence with exercise equipment Exercise tolerated well No report of cardiac concerns or symptoms Strength training completed today  Goals Unmet:  Not Applicable  Comments: check out 0915   Dr. Kathie Dike is Medical Director for [facility] Pulmonary Rehab.

## 2021-02-02 ENCOUNTER — Other Ambulatory Visit: Payer: Self-pay

## 2021-02-02 ENCOUNTER — Encounter (HOSPITAL_COMMUNITY)
Admission: RE | Admit: 2021-02-02 | Discharge: 2021-02-02 | Disposition: A | Discharge status: Home / Self Care | Payer: Medicare Other | Source: Ambulatory Visit | Attending: Interventional Cardiology | Admitting: Interventional Cardiology

## 2021-02-02 DIAGNOSIS — I2121 ST elevation (STEMI) myocardial infarction involving left circumflex coronary artery: Secondary | ICD-10-CM | POA: Diagnosis not present

## 2021-02-02 DIAGNOSIS — Z955 Presence of coronary angioplasty implant and graft: Secondary | ICD-10-CM

## 2021-02-02 NOTE — Progress Notes (Signed)
Daily Session Note  Patient Details  Name: Austin Tucker MRN: [id number] Date of Birth: 1956-06-10 Referring Provider:   Flowsheet Row CARDIAC REHAB PHASE II ORIENTATION from 12/28/2020 in [place]  Referring Provider Dr. Tamala Julian      Encounter Date: 02/02/2021  Check In:  Session Check In - 02/02/21 0815      Check-In   Supervising physician immediately available to respond to emergencies [facility] MD immediately available    Physician(s) Dr. Domenic Polite    Location [facility]    Staff Present Cathren Harsh, MS, Exercise Physiologist;Dalton Kris Mouton, MS, ACSM-CEP, Exercise Physiologist    Virtual Visit No    Medication changes reported     No    Fall or balance concerns reported    No    Tobacco Cessation No Change    Warm-up and Cool-down Performed as group-led instruction    Resistance Training Performed Yes    VAD Patient? No    PAD/SET Patient? No      Pain Assessment   Currently in Pain? No/denies    Pain Score 0-No pain    Multiple Pain Sites No           Capillary Blood Glucose: No results found for this or any previous visit (from the past 24 hour(s)).    Social History   Tobacco Use  Smoking Status Former Smoker  Smokeless Tobacco Never Used    Goals Met:  Independence with exercise equipment Exercise tolerated well No report of cardiac concerns or symptoms Strength training completed today  Goals Unmet:  Not Applicable  Comments: check out 0915   Dr. Kathie Dike is Medical Director for [facility].

## 2021-02-05 ENCOUNTER — Encounter (HOSPITAL_COMMUNITY)
Admission: RE | Admit: 2021-02-05 | Discharge: 2021-02-05 | Disposition: A | Discharge status: Home / Self Care | Payer: Medicare Other | Source: Ambulatory Visit | Attending: Interventional Cardiology | Admitting: Interventional Cardiology

## 2021-02-05 ENCOUNTER — Other Ambulatory Visit: Payer: Self-pay

## 2021-02-05 VITALS — Wt 131.4 lb

## 2021-02-05 DIAGNOSIS — I2121 ST elevation (STEMI) myocardial infarction involving left circumflex coronary artery: Secondary | ICD-10-CM

## 2021-02-05 DIAGNOSIS — Z955 Presence of coronary angioplasty implant and graft: Secondary | ICD-10-CM

## 2021-02-05 NOTE — Progress Notes (Signed)
Daily Session Note  Patient Details  Name: Austin Tucker MRN: [id number] Date of Birth: 14-Aug-1956 Referring Provider:   Flowsheet Row CARDIAC REHAB PHASE II ORIENTATION from 12/28/2020 in [place]  Referring Provider Dr. Tamala Julian      Encounter Date: 02/05/2021  Check In:  Session Check In - 02/05/21 0815      Check-In   Supervising physician immediately available to respond to emergencies [facility] immediately available    Physician(s) Dr. Domenic Polite    Location [facility]    Staff Present Cathren Harsh, MS, Exercise Physiologist;Dalton Kris Mouton, MS, ACSM-CEP, Exercise Physiologist    Virtual Visit No    Medication changes reported     No    Fall or balance concerns reported    No    Tobacco Cessation No Change    Warm-up and Cool-down Performed as group-led instruction    Resistance Training Performed Yes    VAD Patient? No    PAD/SET Patient? No      Pain Assessment   Currently in Pain? No/denies    Pain Score 0-No pain    Multiple Pain Sites No           Capillary Blood Glucose: No results found for this or any previous visit (from the past 24 hour(s)).    Social History   Tobacco Use  Smoking Status Former Smoker  Smokeless Tobacco Never Used    Goals Met:  Independence with exercise equipment Exercise tolerated well No report of cardiac concerns or symptoms Strength training completed today  Goals Unmet:  Not Applicable  Comments: checkout 0915   Dr. Kathie Dike is Medical Director for [facility] Pulmonary Re[facility].

## 2021-02-06 DIAGNOSIS — E119 Type 2 diabetes mellitus without complications: Secondary | ICD-10-CM | POA: Diagnosis not present

## 2021-02-07 ENCOUNTER — Encounter (HOSPITAL_COMMUNITY)
Admission: RE | Admit: 2021-02-07 | Discharge: 2021-02-07 | Disposition: A | Discharge status: Home / Self Care | Payer: Medicare Other | Source: Ambulatory Visit | Attending: Interventional Cardiology | Admitting: Interventional Cardiology

## 2021-02-07 ENCOUNTER — Other Ambulatory Visit: Payer: Self-pay

## 2021-02-07 DIAGNOSIS — I2121 ST elevation (STEMI) myocardial infarction involving left circumflex coronary artery: Secondary | ICD-10-CM | POA: Diagnosis not present

## 2021-02-07 DIAGNOSIS — Z955 Presence of coronary angioplasty implant and graft: Secondary | ICD-10-CM

## 2021-02-07 NOTE — Progress Notes (Signed)
Daily Session Note  Patient Details  Name: Austin Tucker MRN: [id number] Date of Birth: September 03, 1956 Referring Provider:   Flowsheet Row CARDIAC REHAB PHASE II ORIENTATION from 12/28/2020 in [place]  Referring Provider Dr. Tamala Julian      Encounter Date: 02/07/2021  Check In:  Session Check In - 02/07/21 0815      Check-In   Supervising physician immediately available to respond to emergencies [facility] MD immediately available    Physician(s) Dr. Domenic Polite    Location [facility]    Staff Present Geanie Cooley, RN;[place] Audria Nine, MS, Exercise Physiologist    Virtual Visit No    Medication changes reported     No    Fall or balance concerns reported    No    Tobacco Cessation No Change    Warm-up and Cool-down Performed as group-led instruction    Resistance Training Performed Yes    VAD Patient? No    PAD/SET Patient? No      Pain Assessment   Currently in Pain? No/denies    Pain Score 0-No pain    Multiple Pain Sites No           Capillary Blood Glucose: No results found for this or any previous visit (from the past 24 hour(s)).    Social History   Tobacco Use  Smoking Status Former Smoker  Smokeless Tobacco Never Used    Goals Met:  Independence with exercise equipment Exercise tolerated well No report of cardiac concerns or symptoms Strength training completed today  Goals Unmet:  Not Applicable  Comments: check ou @ 9:15am   Dr. Kathie Dike is Medical Director for [facility] Pulmonary Rehab.

## 2021-02-07 NOTE — Progress Notes (Signed)
Cardiac Individual Treatment Plan  Patient Details  Name: Austin Tucker MRN: [id number] Date of Birth: May 07, 1956 Referring Provider:   Flowsheet Row CARDIAC REHAB PHASE II ORIENTATION from 12/28/2020 in [facility] [facility]  Referring Provider Dr. 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  aspirin 81 MG chewable tablet, Chew 1 tablet (81 mg total) by mouth daily., Disp: 90 tablet, Rfl: 3 .  atorvastatin (LIPITOR) 40 MG tablet, Take 1 tablet (40 mg total) by mouth daily., Disp: 90 tablet, Rfl: 1 .  losartan (COZAAR) 25 MG tablet, Take 25 mg by mouth daily., Disp: , Rfl:  .  metFORMIN (GLUCOPHAGE-XR) 500 MG 24 hr tablet, Take 500 mg by mouth daily with breakfast., Disp: , Rfl:  .  metoprolol tartrate (LOPRESSOR) 25 MG tablet, Take 1 tablet (25 mg total) by mouth 2 (two) times daily., Disp: 60 tablet, Rfl: 4 .  nitroGLYCERIN (NITROSTAT) 0.4 MG SL tablet, Place 1 tablet (0.4 mg total) under the tongue every 5 (five) minutes as needed for chest pain., Disp: 25 tablet, Rfl: 1 .  ticagrelor (BRILINTA) 90 MG TABS tablet, Take 1 tablet (90 mg total) by mouth 2 (two) times daily., Disp: 180 tablet, Rfl: 3  Past Medical History: No past medical history on file.  Tobacco Use: Social History   Tobacco Use  Smoking Status Former Smoker  Smokeless Tobacco Never Used    Labs: Recent [employer] for ITP Cardiac and Pulmonary Rehab Latest Ref Rng & Units 11/29/2020 11/29/2020 11/29/2020 11/30/2020   Cholestrol 0 - 200 mg/dL 127 - 118 -   LDLCALC 0 - 99 mg/dL 79 - 56 -   HDL >40 mg/dL 24(L) - 21(L) -   Trlycerides <150 mg/dL 119  - 206(H) -   Hemoglobin A1c 4.8 - 5.6 % 7.8(H) - - 8.0(H)   TCO2 22 - 32 mmol/L - 20(L) - -      Capillary Blood Glucose: Lab Results  Component Value Date   GLUCAP 153 (H) 12/01/2020   GLUCAP 152 (H) 12/01/2020   GLUCAP 132 (H) 11/30/2020   GLUCAP 187 (H) 11/30/2020   GLUCAP 139 (H) 11/30/2020     Exercise Target Goals: Exercise Program Goal: Individual exercise prescription set using results from initial 6 min walk test and THRR while considering  patient's activity barriers and safety.   Exercise Prescription Goal: Starting with aerobic activity 30 plus minutes a day, 3 days per week for initial exercise prescription. Provide home exercise prescription and guidelines that participant acknowledges understanding prior to discharge.  Activity Barriers & Risk Stratification:  Activity Barriers & Cardiac Risk Stratification - 12/28/20 0922      Activity Barriers & Cardiac Risk Stratification   Activity Barriers None    Cardiac Risk Stratification High           6 Minute Walk:  6 Minute Walk    Row Name 12/28/20 1018         6 Minute Walk   Phase Initial     Distance 1400 feet     Walk Time 6 minutes     # of Rest Breaks 0     MPH  2.7     METS 3.18     RPE 8     VO2 Peak 11.15     Symptoms No     Resting HR 76 bpm     Resting BP 102/58     Resting Oxygen Saturation  99 %     Exercise Oxygen Saturation  during 6 min walk 100 %     Max Ex. HR 80 bpm     Max Ex. BP 112/62     2 Minute Post BP 110/60            Oxygen Initial Assessment:   Oxygen Re-Evaluation:   Oxygen Discharge (Final Oxygen Re-Evaluation):   Initial Exercise Prescription:  Initial Exercise Prescription - 12/28/20 1000      Date of Initial Exercise RX and Referring Provider   Date 12/28/20    Referring Provider Dr. Tamala Julian    Expected Discharge Date 03/16/21      NuStep   Level 1    SPM 60    Minutes 39      Intensity   THRR 40-80% of Max Heartrate 62-125    Ratings of  Perceived Exertion 11-15      Resistance Training   Training Prescription Yes    Weight 2    Reps 10-15           Perform Capillary Blood Glucose checks as needed.  Exercise Prescription Changes:   Exercise Prescription Changes    Row Name 01/08/21 1000 01/12/21 1100 01/22/21 1000 02/05/21 1000       Response to Exercise   Blood Pressure (Admit) 116/68 -- 128/74 112/64    Blood Pressure (Exercise) 138/72 -- 164/70 150/58    Blood Pressure (Exit) 108/62 -- 120/64 112/68    Heart Rate (Admit) 83 bpm -- 72 bpm 73 bpm    Heart Rate (Exercise) 98 bpm -- 95 bpm 98 bpm    Heart Rate (Exit) 90 bpm -- 81 bpm 74 bpm    Rating of Perceived Exertion (Exercise) 13 -- 12 11    Duration Continue with 30 min of aerobic exercise without signs/symptoms of physical distress. -- Continue with 30 min of aerobic exercise without signs/symptoms of physical distress. Continue with 30 min of aerobic exercise without signs/symptoms of physical distress.    Intensity THRR unchanged -- THRR unchanged THRR unchanged         Progression   Progression Continue to progress workloads to maintain intensity without signs/symptoms of physical distress. -- Continue to progress workloads to maintain intensity without signs/symptoms of physical distress. Continue to progress workloads to maintain intensity without signs/symptoms of physical distress.         Resistance Training   Training Prescription Yes -- Yes Yes    Weight 2 lbs -- 2 lbs 3 lbs    Reps 10-15 -- 10-15 10-15    Time 10 Minutes -- 10 Minutes 10 Minutes         NuStep   Level 1 -- 2 3    SPM 89 -- 112 100    Minutes 39 -- 39 39    METs 2 -- 2.1 2.2         Home Exercise Plan   Plans to continue exercise at -- Home (comment) -- --    Frequency -- Add 2 additional days to program exercise sessions. -- --    Initial Home Exercises Provided -- 01/12/21 -- --  Exercise Comments:   Exercise Comments    Row Name 01/12/21 1158            Exercise Comments reviewed home exercise with patient. He is not currently exercising at home. We discussed starting to walk at home 2 days per week outside of rehab.              Exercise Goals and Review:   Exercise Goals    Row Name 12/28/20 1020 01/08/21 1038 02/05/21 1030         Exercise Goals   Increase Physical Activity Yes Yes Yes     Intervention Provide advice, education, support and counseling about physical activity/exercise needs.;Develop an individualized exercise prescription for aerobic and resistive training based on initial evaluation findings, risk stratification, comorbidities and participant's personal goals. Provide advice, education, support and counseling about physical activity/exercise needs.;Develop an individualized exercise prescription for aerobic and resistive training based on initial evaluation findings, risk stratification, comorbidities and participant's personal goals. Provide advice, education, support and counseling about physical activity/exercise needs.;Develop an individualized exercise prescription for aerobic and resistive training based on initial evaluation findings, risk stratification, comorbidities and participant's personal goals.     Expected Outcomes Short Term: Attend rehab on a regular basis to increase amount of physical activity.;Long Term: Add in home exercise to make exercise part of routine and to increase amount of physical activity.;Long Term: Exercising regularly at least 3-5 days a week. Short Term: Attend rehab on a regular basis to increase amount of physical activity.;Long Term: Add in home exercise to make exercise part of routine and to increase amount of physical activity.;Long Term: Exercising regularly at least 3-5 days a week. Short Term: Attend rehab on a regular basis to increase amount of physical activity.;Long Term: Add in home exercise to make exercise part of routine and to increase amount of physical  activity.;Long Term: Exercising regularly at least 3-5 days a week.     Increase Strength and Stamina Yes Yes Yes     Intervention Develop an individualized exercise prescription for aerobic and resistive training based on initial evaluation findings, risk stratification, comorbidities and participant's personal goals.;Provide advice, education, support and counseling about physical activity/exercise needs. Develop an individualized exercise prescription for aerobic and resistive training based on initial evaluation findings, risk stratification, comorbidities and participant's personal goals.;Provide advice, education, support and counseling about physical activity/exercise needs. Develop an individualized exercise prescription for aerobic and resistive training based on initial evaluation findings, risk stratification, comorbidities and participant's personal goals.;Provide advice, education, support and counseling about physical activity/exercise needs.     Expected Outcomes Short Term: Increase workloads from initial exercise prescription for resistance, speed, and METs.;Short Term: Perform resistance training exercises routinely during rehab and add in resistance training at home;Long Term: Improve cardiorespiratory fitness, muscular endurance and strength as measured by increased METs and functional capacity (6MWT) Short Term: Increase workloads from initial exercise prescription for resistance, speed, and METs.;Short Term: Perform resistance training exercises routinely during rehab and add in resistance training at home;Long Term: Improve cardiorespiratory fitness, muscular endurance and strength as measured by increased METs and functional capacity (6MWT) Short Term: Increase workloads from initial exercise prescription for resistance, speed, and METs.;Short Term: Perform resistance training exercises routinely during rehab and add in resistance training at home;Long Term: Improve cardiorespiratory  fitness, muscular endurance and strength as measured by increased METs and functional capacity (6MWT)     Able to understand and use rate of perceived exertion (RPE) scale Yes Yes Yes  Intervention Provide education and explanation on how to use RPE scale Provide education and explanation on how to use RPE scale Provide education and explanation on how to use RPE scale     Expected Outcomes Short Term: Able to use RPE daily in rehab to express subjective intensity level;Long Term:  Able to use RPE to guide intensity level when exercising independently Short Term: Able to use RPE daily in rehab to express subjective intensity level;Long Term:  Able to use RPE to guide intensity level when exercising independently Short Term: Able to use RPE daily in rehab to express subjective intensity level;Long Term:  Able to use RPE to guide intensity level when exercising independently     Knowledge and understanding of Target Heart Rate Range (THRR) Yes Yes Yes     Intervention Provide education and explanation of THRR including how the numbers were predicted and where they are located for reference Provide education and explanation of THRR including how the numbers were predicted and where they are located for reference Provide education and explanation of THRR including how the numbers were predicted and where they are located for reference     Expected Outcomes Short Term: Able to state/look up THRR;Long Term: Able to use THRR to govern intensity when exercising independently;Short Term: Able to use daily as guideline for intensity in rehab Short Term: Able to state/look up THRR;Long Term: Able to use THRR to govern intensity when exercising independently;Short Term: Able to use daily as guideline for intensity in rehab Short Term: Able to state/look up THRR;Long Term: Able to use THRR to govern intensity when exercising independently;Short Term: Able to use daily as guideline for intensity in rehab     Able to  check pulse independently Yes Yes Yes     Intervention Provide education and demonstration on how to check pulse in carotid and radial arteries.;Review the importance of being able to check your own pulse for safety during independent exercise -- Provide education and demonstration on how to check pulse in carotid and radial arteries.;Review the importance of being able to check your own pulse for safety during independent exercise     Expected Outcomes Short Term: Able to explain why pulse checking is important during independent exercise;Long Term: Able to check pulse independently and accurately Short Term: Able to explain why pulse checking is important during independent exercise;Long Term: Able to check pulse independently and accurately Short Term: Able to explain why pulse checking is important during independent exercise;Long Term: Able to check pulse independently and accurately     Understanding of Exercise Prescription Yes Yes Yes     Intervention Provide education, explanation, and written materials on patient's individual exercise prescription Provide education, explanation, and written materials on patient's individual exercise prescription Provide education, explanation, and written materials on patient's individual exercise prescription     Expected Outcomes Short Term: Able to explain program exercise prescription;Long Term: Able to explain home exercise prescription to exercise independently Short Term: Able to explain program exercise prescription;Long Term: Able to explain home exercise prescription to exercise independently Short Term: Able to explain program exercise prescription;Long Term: Able to explain home exercise prescription to exercise independently            Exercise Goals Re-Evaluation :  Exercise Goals Re-Evaluation    Row Name 01/08/21 1039 02/05/21 1031           Exercise Goal Re-Evaluation   Exercise Goals Review Increase Physical Activity;Increase Strength  and Stamina;Able to understand and  use rate of perceived exertion (RPE) scale;Knowledge and understanding of Target Heart Rate Range (THRR);Able to check pulse independently;Understanding of Exercise Prescription Increase Physical Activity;Increase Strength and Stamina;Able to understand and use rate of perceived exertion (RPE) scale;Knowledge and understanding of Target Heart Rate Range (THRR);Able to check pulse independently;Understanding of Exercise Prescription      Comments Patient has completed 4 exercise sessions. He is tolerating exercise well and increasing his spm on the NuStep quickly. He is catching on to the program routine very well and continues to improve. He has a very positive attitute about rehab and enjoys coming. He is currently exercising at 2.0 METs on the NuStep. Will continue to progress as able. patient has completed 16 exercise sessions. He is tolerating exercise well and increasing his intensities. He is improving by increasing his strength as well as his endurance. He says that he is getting stronger and he can tell that the program is helping him in his daily life. He is exercising at home and continues to do well. He is currently exercising at 2.2 METs on the NuStep. Will continue to monitor and progress as able.      Expected Outcomes Through exercise at rehab and a home exercise program , patient will achieve their goals. Through exercise at rehab and a home exercise program , patient will achieve their goals.              Discharge Exercise Prescription (Final Exercise Prescription Changes):  Exercise Prescription Changes - 02/05/21 1000      Response to Exercise   Blood Pressure (Admit) 112/64    Blood Pressure (Exercise) 150/58    Blood Pressure (Exit) 112/68    Heart Rate (Admit) 73 bpm    Heart Rate (Exercise) 98 bpm    Heart Rate (Exit) 74 bpm    Rating of Perceived Exertion (Exercise) 11    Duration Continue with 30 min of aerobic exercise without  signs/symptoms of physical distress.    Intensity THRR unchanged      Progression   Progression Continue to progress workloads to maintain intensity without signs/symptoms of physical distress.      Resistance Training   Training Prescription Yes    Weight 3 lbs    Reps 10-15    Time 10 Minutes      NuStep   Level 3    SPM 100    Minutes 39    METs 2.2           Nutrition:  Target Goals: Understanding of nutrition guidelines, daily intake of sodium 1500mg , cholesterol 200mg , calories 30% from fat and 7% or less from saturated fats, daily to have 5 or more servings of fruits and vegetables.  Biometrics:  Pre Biometrics - 02/05/21 1030      Pre Biometrics   Weight 59.6 kg            Nutrition Therapy Plan and Nutrition Goals:  Nutrition Therapy & Goals - 01/29/21 0942      Personal Nutrition Goals   Comments We will continue to provide heart health nutritional education through handouts.      Intervention Plan   Intervention Nutrition handout(s) given to patient.           Nutrition Assessments:  Nutrition Assessments - 12/28/20 1042      MEDFICTS Scores   Pre Score 33          MEDIFICTS Score Key:  ?70 Need to make dietary changes   40-70 Heart  Healthy Diet  ? 40 Therapeutic Level Cholesterol Diet   Picture Your Plate Scores:  <35 Unhealthy dietary pattern with much room for improvement.  41-50 Dietary pattern unlikely to meet recommendations for good health and room for improvement.  51-60 More healthful dietary pattern, with some room for improvement.   >60 Healthy dietary pattern, although there may be some specific behaviors that could be improved.    Nutrition Goals Re-Evaluation:   Nutrition Goals Discharge (Final Nutrition Goals Re-Evaluation):   Psychosocial: Target Goals: Acknowledge presence or absence of significant depression and/or stress, maximize coping skills, provide positive support system. Participant is able to  verbalize types and ability to use techniques and skills needed for reducing stress and depression.  Initial Review & Psychosocial Screening:  Initial Psych Review & Screening - 12/28/20 1033      Initial Review   Current issues with None Identified      Family Dynamics   Good Support System? Yes    Comments Patient lives with his wife of many years. He has one daughter that lives in [place] that he is close with.  He says his wife is his support system and his chruch family. He denies any depression, stress, or anxiety. He says he is excited about starting cardiac rehab. He demonstrates a very positive outlook and attitude regarding his furture and health.      Barriers   Psychosocial barriers to participate in program Psychosocial barriers identified (see note)      Screening Interventions   Interventions Encouraged to exercise;Program counselor consult    Expected Outcomes Short Term goal: Identification and review with participant of any Quality of Life or Depression concerns found by scoring the questionnaire.           Quality of Life Scores:  Quality of Life - 12/28/20 1022      Quality of Life   Select Quality of Life      Quality of Life Scores   Health/Function Pre 18.8 %    Socioeconomic Pre 22.58 %    Psych/Spiritual Pre 21.79 %    Family Pre 19.8 %    GLOBAL Pre 20.27 %          Scores of 19 and below usually indicate a poorer quality of life in these areas.  A difference of  2-3 points is a clinically meaningful difference.  A difference of 2-3 points in the total score of the Quality of Life Index has been associated with significant improvement in overall quality of life, self-image, physical symptoms, and general health in studies assessing change in quality of life.  PHQ-9: Recent Review Flowsheet Data    Depression screen [facility] 2/9 12/28/2020   Decreased Interest 0   Down, Depressed, Hopeless 0   PHQ - 2 Score 0   Altered sleeping 1    Tired, decreased  energy 0   Change in appetite 0   Feeling bad or failure about yourself  0   Trouble concentrating 0   Moving slowly or fidgety/restless 0   Suicidal thoughts 0   PHQ-9 Score 1   Difficult doing work/chores Not difficult at all     Interpretation of Total Score  Total Score Depression Severity:  1-4 = Minimal depression, 5-9 = Mild depression, 10-14 = Moderate depression, 15-19 = Moderately severe depression, 20-27 = Severe depression   Psychosocial Evaluation and Intervention:  Psychosocial Evaluation - 12/28/20 1036      Psychosocial Evaluation & Interventions   Interventions Stress  management education;Relaxation education;Encouraged to exercise with the program and follow exercise prescription    Comments Patient has no psychosocial issues identified at his orientation visit. His QOL score was 20.27 overall scoring lowest in health/function. His PHQ-9 socre was 1. Will continue to monitor.    Expected Outcomes Patient will have no psychosocial issues identified at discharge.    Continue Psychosocial Services  No Follow up required           Psychosocial Re-Evaluation:  Psychosocial Re-Evaluation    [place] Name 01/01/21 1402 01/29/21 1660           Psychosocial Re-Evaluation   Current issues with None Identified None Identified      Comments Patient is new to the program completing 2 sessions. He has no psychosocial issues identified. He is excited to start the program and looks forward to exercising. We will continue to monitor. Patient is new to the program completing 14 sessions. He has no psychosocial issues identified. Patient seems to enjoy coming to the program and is always smiling. He continues to demonstrate a very positive attitude and is very interactive with staff.  We will continue to monitor.      Expected Outcomes Patient will have no psychosocial issues identified at discharge. Patient will have no psychosocial issues identified at discharge.      Interventions  Stress management education;Encouraged to attend Cardiac Rehabilitation for the exercise;Relaxation education Stress management education;Encouraged to attend Cardiac Rehabilitation for the exercise;Relaxation education      Continue Psychosocial Services  No Follow up required No Follow up required             Psychosocial Discharge (Final Psychosocial Re-Evaluation):  Psychosocial Re-Evaluation - 01/29/21 6301      Psychosocial Re-Evaluation   Current issues with None Identified    Comments Patient is new to the program completing 14 sessions. He has no psychosocial issues identified. Patient seems to enjoy coming to the program and is always smiling. He continues to demonstrate a very positive attitude and is very interactive with staff.  We will continue to monitor.    Expected Outcomes Patient will have no psychosocial issues identified at discharge.    Interventions Stress management education;Encouraged to attend Cardiac Rehabilitation for the exercise;Relaxation education    Continue Psychosocial Services  No Follow up required           Vocational Rehabilitation: Provide vocational rehab assistance to qualifying candidates.   Vocational Rehab Evaluation & Intervention:  Vocational Rehab - 12/28/20 1043      Initial Vocational Rehab Evaluation & Intervention   Assessment shows need for Vocational Rehabilitation No      Vocational Rehab Re-Evaulation   Comments Patient is retired and does not need vocational rehab.           Education: Education Goals: Education classes will be provided on a weekly basis, covering required topics. Participant will state understanding/return demonstration of topics presented.  Learning Barriers/Preferences:  Learning Barriers/Preferences - 12/28/20 1042      Learning Barriers/Preferences   Learning Barriers None    Learning Preferences Video;Written Material;Audio           Education Topics: Hypertension, Hypertension  Reduction -Define heart disease and high blood pressure. Discus how high blood pressure affects the body and ways to reduce high blood pressure.   Exercise and Your Heart -Discuss why it is important to exercise, the FITT principles of exercise, normal and abnormal responses to exercise, and how to exercise safely.  Angina -Discuss definition of angina, causes of angina, treatment of angina, and how to decrease risk of having angina.   Cardiac Medications -Review what the following cardiac medications are used for, how they affect the body, and side effects that may occur when taking the medications.  Medications include Aspirin, Beta blockers, calcium channel blockers, ACE Inhibitors, angiotensin receptor blockers, diuretics, digoxin, and antihyperlipidemics.   Congestive Heart Failure -Discuss the definition of CHF, how to live with CHF, the signs and symptoms of CHF, and how keep track of weight and sodium intake. Flowsheet Row CARDIAC REHAB PHASE II EXERCISE from 01/24/2021 in [place]  Date 01/03/21  Educator Mk  Instruction Review Code 2- Demonstrated Understanding      Heart Disease and Intimacy -Discus the effect sexual activity has on the heart, how changes occur during intimacy as we age, and safety during sexual activity. Flowsheet Row CARDIAC REHAB PHASE II EXERCISE from 01/24/2021 in [place]  Date 01/10/21  Educator DF  Instruction Review Code 2- Demonstrated Understanding      Smoking Cessation / COPD -Discuss different methods to quit smoking, the health benefits of quitting smoking, and the definition of COPD. Flowsheet Row CARDIAC REHAB PHASE II EXERCISE from 01/24/2021 in [place]  Date 01/17/21  Educator DF  Instruction Review Code 2- Demonstrated Understanding      Nutrition I: Fats -Discuss the types of cholesterol, what cholesterol does to the heart, and how cholesterol levels  can be controlled. Flowsheet Row CARDIAC REHAB PHASE II EXERCISE from 01/24/2021 in [place]  Date 01/24/21  Educator mk  Instruction Review Code 2- Demonstrated Understanding      Nutrition II: Labels -Discuss the different components of food labels and how to read food label   Heart Parts/Heart Disease and PAD -Discuss the anatomy of the heart, the pathway of blood circulation through the heart, and these are affected by heart disease.   Stress I: Signs and Symptoms -Discuss the causes of stress, how stress may lead to anxiety and depression, and ways to limit stress.   Stress II: Relaxation -Discuss different types of relaxation techniques to limit stress.   Warning Signs of Stroke / TIA -Discuss definition of a stroke, what the signs and symptoms are of a stroke, and how to identify when someone is having stroke.   Knowledge Questionnaire Score:  Knowledge Questionnaire Score - 12/28/20 1043      Knowledge Questionnaire Score   Pre Score 24/28           Core Components/Risk Factors/Patient Goals at Admission:  Personal Goals and Risk Factors at Admission - 12/28/20 1045      Core Components/Risk Factors/Patient Goals on Admission    Weight Management Weight Maintenance    Diabetes Yes    Intervention Provide education about signs/symptoms and action to take for hypo/hyperglycemia.;Provide education about proper nutrition, including hydration, and aerobic/resistive exercise prescription along with prescribed medications to achieve blood glucose in normal ranges: Fasting glucose 65-99 mg/dL    Expected Outcomes Short Term: Participant verbalizes understanding of the signs/symptoms and immediate care of hyper/hypoglycemia, proper foot care and importance of medication, aerobic/resistive exercise and nutrition plan for blood glucose control.;Long Term: Attainment of HbA1C < 7%.    Personal Goal Other Yes    Personal Goal Learn about eating  healthier; Get stronger; Get back to doing his ADL's; get healthier overall.    Intervention Provide education and monitored exercise for 12 weeks.  Expected Outcomes Patient will meet both program and personal goals.           Core Components/Risk Factors/Patient Goals Review:   Goals and Risk Factor Review    Row Name 01/01/21 1405 01/29/21 1007           Core Components/Risk Factors/Patient Goals Review   Personal Goals Review Weight Management/Obesity;Other;Diabetes --      Review Patient is new to the program completing 2 sessions. He was referred to cardiac rebab with [facility]. He has multiple risk factors for CAD and is doing the program for risk modification. His last A1C on file was 8.0% on 11/30/20. He is a newly diagnosed diabetic using Metformin and diet for control. His personal goals for the program are to get stronger; be able to return to his ADL's and to learn more about healthy eating and get health overall. We will continue to monitor for progress as he works toward meeting these goals. Patient has completed 14 sessions losing 2 lbs since last 30 day review. He is doing well in the program with progression and consistent attendance. He saw a dermotalogist 2/8 due to chronic psoriasis. MD discontinues methotrexate due to a h/o CHF and added Quantiferon and ilumya 100 gm SQ to manage the psoriasis. Patient's b/p is well controlled. His reported glucose readings are WNL.He is a newly diagnosed Diabetic. His A1C was 8% 11/30/20. He is using Meformin and diet for control. His personal goals for the program are to get stronger; return to his ADL's and to learn about healthy eating and get healthier overall.      Expected Outcomes Patient will complete the program meeting both personal and program goal.s Patient will complete the program meeting both personal and program goal.s             Core Components/Risk Factors/Patient Goals at Discharge (Final Review):   Goals and Risk  Factor Review - 01/29/21 1007      Core Components/Risk Factors/Patient Goals Review   Review Patient has completed 14 sessions losing 2 lbs since last 30 day review. He is doing well in the program with progression and consistent attendance. He saw a dermotalogist 2/8 due to chronic psoriasis. MD discontinues methotrexate due to a h/o CHF and added Quantiferon and ilumya 100 gm SQ to manage the psoriasis. Patient's b/p is well controlled. His reported glucose readings are WNL.He is a newly diagnosed Diabetic. His A1C was 8% 11/30/20. He is using Meformin and diet for control. His personal goals for the program are to get stronger; return to his ADL's and to learn about healthy eating and get healthier overall.    Expected Outcomes Patient will complete the program meeting both personal and program goal.s           ITP Comments:   Comments: ITP REVIEW Pt is making expected progress toward Cardiac Rehab goals after completing 17 sessions. Recommend continued exercise, life style modification, education, and increased stamina and strength.

## 2021-02-08 DIAGNOSIS — I10 Essential (primary) hypertension: Secondary | ICD-10-CM | POA: Diagnosis not present

## 2021-02-08 DIAGNOSIS — Z299 Encounter for prophylactic measures, unspecified: Secondary | ICD-10-CM | POA: Diagnosis not present

## 2021-02-08 DIAGNOSIS — E1165 Type 2 diabetes mellitus with hyperglycemia: Secondary | ICD-10-CM | POA: Diagnosis not present

## 2021-02-08 DIAGNOSIS — Z6824 Body mass index (BMI) 24.0-24.9, adult: Secondary | ICD-10-CM | POA: Diagnosis not present

## 2021-02-08 DIAGNOSIS — I2510 Atherosclerotic heart disease of native coronary artery without angina pectoris: Secondary | ICD-10-CM | POA: Diagnosis not present

## 2021-02-08 DIAGNOSIS — E7800 Pure hypercholesterolemia, unspecified: Secondary | ICD-10-CM | POA: Diagnosis not present

## 2021-02-09 ENCOUNTER — Encounter (HOSPITAL_COMMUNITY)
Admission: RE | Admit: 2021-02-09 | Discharge: 2021-02-09 | Disposition: A | Discharge status: Home / Self Care | Payer: Medicare Other | Source: Ambulatory Visit | Attending: Interventional Cardiology | Admitting: Interventional Cardiology

## 2021-02-09 ENCOUNTER — Other Ambulatory Visit: Payer: Self-pay

## 2021-02-09 DIAGNOSIS — I2121 ST elevation (STEMI) myocardial infarction involving left circumflex coronary artery: Secondary | ICD-10-CM

## 2021-02-09 DIAGNOSIS — Z955 Presence of coronary angioplasty implant and graft: Secondary | ICD-10-CM | POA: Diagnosis not present

## 2021-02-09 NOTE — Progress Notes (Signed)
Daily Session Note  Patient Details  Name: Austin Tucker MRN: [id number] Date of Birth: 10/09/1956 Referring Provider:   Flowsheet Row CARDIAC REHAB PHASE II ORIENTATION from 12/28/2020 in [place]  Referring Provider Dr. Tamala Julian      Encounter Date: 02/09/2021  Check In:  Session Check In - 02/09/21 0815      Check-In   Supervising physician immediately available to respond to emergencies [facility] MD immediately available    Physician(s) Dr. Johnsie Cancel    Location [facility]    Staff Present Aundra Dubin, RN, Bjorn Loser, MS, ACSM-CEP, Exercise Physiologist    Virtual Visit No    Medication changes reported     No    Fall or balance concerns reported    No    Tobacco Cessation No Change    Warm-up and Cool-down Performed as group-led instruction    Resistance Training Performed Yes    VAD Patient? No    PAD/SET Patient? No      Pain Assessment   Currently in Pain? No/denies    Pain Score 0-No pain    Multiple Pain Sites No           Capillary Blood Glucose: No results found for this or any previous visit (from the past 24 hour(s)).    Social History   Tobacco Use  Smoking Status Former Smoker  Smokeless Tobacco Never Used    Goals Met:  Independence with exercise equipment Exercise tolerated well No report of cardiac concerns or symptoms Strength training completed today  Goals Unmet:  Not Applicable  Comments: checkout time is 0915   Dr. Kathie Dike is Medical Director for [facility] Pulmonary Rehab.

## 2021-02-12 ENCOUNTER — Other Ambulatory Visit: Payer: Self-pay

## 2021-02-12 ENCOUNTER — Encounter (HOSPITAL_COMMUNITY)
Admission: RE | Admit: 2021-02-12 | Discharge: 2021-02-12 | Disposition: A | Discharge status: Home / Self Care | Payer: Medicare Other | Source: Ambulatory Visit | Attending: Interventional Cardiology | Admitting: Interventional Cardiology

## 2021-02-12 DIAGNOSIS — Z955 Presence of coronary angioplasty implant and graft: Secondary | ICD-10-CM | POA: Diagnosis not present

## 2021-02-12 DIAGNOSIS — I2121 ST elevation (STEMI) myocardial infarction involving left circumflex coronary artery: Secondary | ICD-10-CM

## 2021-02-12 NOTE — Progress Notes (Signed)
Daily Session Note  Patient Details  Name: Austin Tucker MRN: [id number] Date of Birth: 1956-07-08 Referring Provider:   Flowsheet Row CARDIAC REHAB PHASE II ORIENTATION from 12/28/2020 in [place]  Referring Provider Dr. Tamala Julian      Encounter Date: 02/12/2021  Check In:  Session Check In - 02/12/21 0815      Check-In   Supervising physician immediately available to respond to emergencies [facility] MD immediately available    Physician(s) Dr. Harrington Challenger    Location [facility]    Staff Present Cathren Harsh, MS, Exercise Physiologist;Dalton Kris Mouton, MS, ACSM-CEP, Exercise Physiologist    Virtual Visit No    Medication changes reported     No    Fall or balance concerns reported    No    Tobacco Cessation No Change    Warm-up and Cool-down Performed as group-led instruction    Resistance Training Performed Yes    VAD Patient? No    PAD/SET Patient? No      Pain Assessment   Currently in Pain? No/denies    Pain Score 0-No pain    Multiple Pain Sites No           Capillary Blood Glucose: No results found for this or any previous visit (from the past 24 hour(s)).    Social History   Tobacco Use  Smoking Status Former Smoker  Smokeless Tobacco Never Used    Goals Met:  Independence with exercise equipment Exercise tolerated well No report of cardiac concerns or symptoms Strength training completed today  Goals Unmet:  Not Applicable  Comments: check out 0915   Dr. Kathie Dike is Medical Director for [facility] Pulmonary Rehab.

## 2021-02-14 ENCOUNTER — Other Ambulatory Visit: Payer: Self-pay

## 2021-02-14 ENCOUNTER — Encounter (HOSPITAL_COMMUNITY)
Admission: RE | Admit: 2021-02-14 | Discharge: 2021-02-14 | Disposition: A | Discharge status: Home / Self Care | Payer: Medicare Other | Source: Ambulatory Visit | Attending: Interventional Cardiology | Admitting: Interventional Cardiology

## 2021-02-14 DIAGNOSIS — Z955 Presence of coronary angioplasty implant and graft: Secondary | ICD-10-CM

## 2021-02-14 DIAGNOSIS — I2121 ST elevation (STEMI) myocardial infarction involving left circumflex coronary artery: Secondary | ICD-10-CM

## 2021-02-14 NOTE — Progress Notes (Signed)
Daily Session Note  Patient Details  Name: Austin Tucker MRN: [id number] Date of Birth: 1956/05/20 Referring Provider:   Flowsheet Row CARDIAC REHAB PHASE II ORIENTATION from 12/28/2020 in [place]  Referring Provider Dr. Tamala Julian      Encounter Date: 02/14/2021  Check In:  Session Check In - 02/14/21 0815      Check-In   Supervising physician immediately available to respond to emergencies [facility] MD immediately available    Physician(s) Dr. Harrington Challenger    Location [facility]    Staff Present Cathren Harsh, MS, Exercise Physiologist;Dalton Kris Mouton, MS, ACSM-CEP, Exercise Physiologist    Virtual Visit No    Medication changes reported     No    Fall or balance concerns reported    No    Tobacco Cessation No Change    Warm-up and Cool-down Performed as group-led instruction    Resistance Training Performed Yes    VAD Patient? No    PAD/SET Patient? No      Pain Assessment   Currently in Pain? No/denies    Pain Score 0-No pain    Multiple Pain Sites No           Capillary Blood Glucose: No results found for this or any previous visit (from the past 24 hour(s)).    Social History   Tobacco Use  Smoking Status Former Smoker  Smokeless Tobacco Never Used    Goals Met:  Independence with exercise equipment Exercise tolerated well No report of cardiac concerns or symptoms Strength training completed today  Goals Unmet:  Not Applicable  Comments: check out 0915   Dr. Kathie Dike is Medical Director for [facility] Pulmonary Rehab.

## 2021-02-16 ENCOUNTER — Encounter (HOSPITAL_COMMUNITY)
Admission: RE | Admit: 2021-02-16 | Discharge: 2021-02-16 | Disposition: A | Discharge status: Home / Self Care | Payer: Medicare Other | Source: Ambulatory Visit | Attending: Interventional Cardiology | Admitting: Interventional Cardiology

## 2021-02-16 ENCOUNTER — Other Ambulatory Visit: Payer: Self-pay

## 2021-02-16 DIAGNOSIS — I2121 ST elevation (STEMI) myocardial infarction involving left circumflex coronary artery: Secondary | ICD-10-CM

## 2021-02-16 DIAGNOSIS — Z955 Presence of coronary angioplasty implant and graft: Secondary | ICD-10-CM

## 2021-02-16 NOTE — Progress Notes (Signed)
Daily Session Note  Patient Details  Name: QUASHAUN LAZALDE MRN: [id number] Date of Birth: 02/14/1956 Referring Provider:   Flowsheet Row CARDIAC REHAB PHASE II ORIENTATION from 12/28/2020 in [place]  Referring Provider Dr. Tamala Julian      Encounter Date: 02/16/2021  Check In:  Session Check In - 02/16/21 0815      Check-In   Supervising physician immediately available to respond to emergencies [facility] MD immediately available    Physician(s) Dr. Domenic Polite    Location [facility]    Staff Present Cathren Harsh, MS, Exercise Physiologist;Dalton Kris Mouton, MS, ACSM-CEP, Exercise Physiologist    Virtual Visit No    Medication changes reported     No    Fall or balance concerns reported    No    Tobacco Cessation No Change    Warm-up and Cool-down Performed as group-led instruction    Resistance Training Performed Yes    VAD Patient? No    PAD/SET Patient? No      Pain Assessment   Currently in Pain? No/denies    Pain Score 0-No pain    Multiple Pain Sites No           Capillary Blood Glucose: No results found for this or any previous visit (from the past 24 hour(s)).    Social History   Tobacco Use  Smoking Status Former Smoker  Smokeless Tobacco Never Used    Goals Met:  Independence with exercise equipment Exercise tolerated well No report of cardiac concerns or symptoms Strength training completed today  Goals Unmet:  Not Applicable  Comments: check out 0915   Dr. Kathie Dike is Medical Director for [facility] Pulmonary Rehab.

## 2021-02-19 ENCOUNTER — Other Ambulatory Visit: Payer: Self-pay

## 2021-02-19 ENCOUNTER — Encounter (HOSPITAL_COMMUNITY)
Admission: RE | Admit: 2021-02-19 | Discharge: 2021-02-19 | Disposition: A | Discharge status: Home / Self Care | Payer: Medicare Other | Source: Ambulatory Visit | Attending: Interventional Cardiology | Admitting: Interventional Cardiology

## 2021-02-19 VITALS — Wt 127.2 lb

## 2021-02-19 DIAGNOSIS — I2121 ST elevation (STEMI) myocardial infarction involving left circumflex coronary artery: Secondary | ICD-10-CM

## 2021-02-19 DIAGNOSIS — Z955 Presence of coronary angioplasty implant and graft: Secondary | ICD-10-CM | POA: Diagnosis not present

## 2021-02-19 NOTE — Progress Notes (Signed)
Daily Session Note  Patient Details  Name: Austin Tucker MRN: [id number] Date of Birth: 1956/07/12 Referring Provider:   Flowsheet Row CARDIAC REHAB PHASE II ORIENTATION from 12/28/2020 in [place]  Referring Provider Dr. Tamala Julian      Encounter Date: 02/19/2021  Check In:  Session Check In - 02/19/21 0815      Check-In   Supervising physician immediately available to respond to emergencies [facility] MD immediately available    Physician(s) Dr. Harl Bowie    Location [facility]    Staff Present Cathren Harsh, MS, Exercise Physiologist;Dalton Kris Mouton, MS, ACSM-CEP, Exercise Physiologist    Virtual Visit No    Medication changes reported     No    Fall or balance concerns reported    No    Tobacco Cessation No Change    Warm-up and Cool-down Performed as group-led instruction    Resistance Training Performed Yes    VAD Patient? No    PAD/SET Patient? No      Pain Assessment   Currently in Pain? No/denies    Pain Score 0-No pain    Multiple Pain Sites No           Capillary Blood Glucose: No results found for this or any previous visit (from the past 24 hour(s)).    Social History   Tobacco Use  Smoking Status Former Smoker  Smokeless Tobacco Never Used    Goals Met:  Independence with exercise equipment Exercise tolerated well No report of cardiac concerns or symptoms Strength training completed today  Goals Unmet:  Not Applicable  Comments: check out 0915   Dr. Kathie Dike is Medical Director for [facility] Pulmonary Rehab.

## 2021-02-21 ENCOUNTER — Encounter (HOSPITAL_COMMUNITY)
Admission: RE | Admit: 2021-02-21 | Discharge: 2021-02-21 | Disposition: A | Discharge status: Home / Self Care | Payer: Medicare Other | Source: Ambulatory Visit | Attending: Interventional Cardiology | Admitting: Interventional Cardiology

## 2021-02-21 ENCOUNTER — Other Ambulatory Visit: Payer: Self-pay

## 2021-02-21 DIAGNOSIS — Z955 Presence of coronary angioplasty implant and graft: Secondary | ICD-10-CM

## 2021-02-21 DIAGNOSIS — I2121 ST elevation (STEMI) myocardial infarction involving left circumflex coronary artery: Secondary | ICD-10-CM | POA: Diagnosis not present

## 2021-02-21 NOTE — Progress Notes (Signed)
Daily Session Note  Patient Details  Name: Austin Tucker MRN: [id number] Date of Birth: [birth year] Referring Provider:   Flowsheet Row CARDIAC REHAB PHASE II ORIENTATION from 12/28/2020 in [place]  Referring Provider Dr. Tamala Julian      Encounter Date: 02/21/2021  Check In:  Session Check In - 02/21/21 0815      Check-In   Supervising physician immediately available to respond to emergencies [facility] MD immediately available    Physician(s) Dr. Harl Bowie    Location [facility]    Staff Present Cathren Harsh, MS, Exercise Physiologist;Dalton Kris Mouton, MS, ACSM-CEP, Exercise Physiologist    Virtual Visit No    Medication changes reported     No    Fall or balance concerns reported    No    Tobacco Cessation No Change    Warm-up and Cool-down Performed as group-led instruction    Resistance Training Performed Yes    VAD Patient? No    PAD/SET Patient? No      Pain Assessment   Currently in Pain? No/denies    Pain Score 0-No pain    Multiple Pain Sites No           Capillary Blood Glucose: No results found for this or any previous visit (from the past 24 hour(s)).    Social History   Tobacco Use  Smoking Status Former Smoker  Smokeless Tobacco Never Used    Goals Met:  Independence with exercise equipment Exercise tolerated well No report of cardiac concerns or symptoms Strength training completed today  Goals Unmet:  Not Applicable  Comments: check out 0915   Dr. Kathie Dike is Medical Director for [facility] Pulmonary Rehab.

## 2021-02-23 ENCOUNTER — Encounter (HOSPITAL_COMMUNITY)
Admission: RE | Admit: 2021-02-23 | Discharge: 2021-02-23 | Disposition: A | Discharge status: Home / Self Care | Payer: Medicare Other | Source: Ambulatory Visit | Attending: Interventional Cardiology | Admitting: Interventional Cardiology

## 2021-02-23 ENCOUNTER — Other Ambulatory Visit: Payer: Self-pay

## 2021-02-23 DIAGNOSIS — I2121 ST elevation (STEMI) myocardial infarction involving left circumflex coronary artery: Secondary | ICD-10-CM | POA: Diagnosis not present

## 2021-02-23 DIAGNOSIS — Z955 Presence of coronary angioplasty implant and graft: Secondary | ICD-10-CM

## 2021-02-23 NOTE — Progress Notes (Signed)
Daily Session Note  Patient Details  Name: Austin Tucker MRN: [id number] Date of Birth: Aug 21, 1956 Referring Provider:   Flowsheet Row CARDIAC REHAB PHASE II ORIENTATION from 12/28/2020 in [place]  Referring Provider Dr. Tamala Julian      Encounter Date: 02/23/2021  Check In:  Session Check In - 02/23/21 0815      Check-In   Supervising physician immediately available to respond to emergencies [facility] MD immediately available    Physician(s) Dr. Harrington Challenger    Location [facility]    Staff Present Cathren Harsh, MS, Exercise Physiologist;Dalton Kris Mouton, MS, ACSM-CEP, Exercise Physiologist    Virtual Visit No    Medication changes reported     No    Fall or balance concerns reported    No    Tobacco Cessation No Change    Warm-up and Cool-down Performed as group-led instruction    Resistance Training Performed Yes    VAD Patient? No    PAD/SET Patient? No      Pain Assessment   Currently in Pain? No/denies    Pain Score 0-No pain    Multiple Pain Sites No           Capillary Blood Glucose: No results found for this or any previous visit (from the past 24 hour(s)).    Social History   Tobacco Use  Smoking Status Former Smoker  Smokeless Tobacco Never Used    Goals Met:  Independence with exercise equipment Exercise tolerated well No report of cardiac concerns or symptoms Strength training completed today  Goals Unmet:  Not Applicable  Comments: check out 0915   Dr. Kathie Dike is Medical Director for [facility] Pulmonary Rehab.

## 2021-02-26 ENCOUNTER — Encounter (HOSPITAL_COMMUNITY)
Admission: RE | Admit: 2021-02-26 | Discharge: 2021-02-26 | Disposition: A | Discharge status: Home / Self Care | Payer: Medicare Other | Source: Ambulatory Visit | Attending: Interventional Cardiology | Admitting: Interventional Cardiology

## 2021-02-26 ENCOUNTER — Other Ambulatory Visit: Payer: Self-pay

## 2021-02-26 DIAGNOSIS — I2121 ST elevation (STEMI) myocardial infarction involving left circumflex coronary artery: Secondary | ICD-10-CM

## 2021-02-26 DIAGNOSIS — Z955 Presence of coronary angioplasty implant and graft: Secondary | ICD-10-CM

## 2021-02-26 NOTE — Progress Notes (Signed)
Daily Session Note  Patient Details  Name: Austin Tucker MRN: [id number] Date of Birth: December 05, 1955 Referring Provider:   Flowsheet Row CARDIAC REHAB PHASE II ORIENTATION from 12/28/2020 in [place]  Referring Provider Dr. Tamala Julian      Encounter Date: 02/26/2021  Check In:  Session Check In - 02/26/21 0815      Check-In   Supervising physician immediately available to respond to emergencies [facility] MD immediately available    Physician(s) Dr. Domenic Polite    Location [facility]    Staff Present Cathren Harsh, MS, Exercise Physiologist;Dalton Kris Mouton, MS, ACSM-CEP, Exercise Physiologist    Virtual Visit No    Medication changes reported     No    Fall or balance concerns reported    No    Tobacco Cessation No Change    Warm-up and Cool-down Performed as group-led instruction    Resistance Training Performed Yes    VAD Patient? No    PAD/SET Patient? No      Pain Assessment   Currently in Pain? No/denies    Pain Score 0-No pain    Multiple Pain Sites No           Capillary Blood Glucose: No results found for this or any previous visit (from the past 24 hour(s)).    Social History   Tobacco Use  Smoking Status Former Smoker  Smokeless Tobacco Never Used    Goals Met:  Independence with exercise equipment Exercise tolerated well No report of cardiac concerns or symptoms Strength training completed today  Goals Unmet:  Not Applicable  Comments: check out 0915   Dr. Kathie Dike is Medical Director for [facility] Pulmonary Rehab.

## 2021-02-28 ENCOUNTER — Encounter (HOSPITAL_COMMUNITY)
Admission: RE | Admit: 2021-02-28 | Discharge: 2021-02-28 | Disposition: A | Discharge status: Home / Self Care | Payer: Medicare Other | Source: Ambulatory Visit | Attending: Interventional Cardiology | Admitting: Interventional Cardiology

## 2021-02-28 ENCOUNTER — Other Ambulatory Visit: Payer: Self-pay

## 2021-02-28 DIAGNOSIS — Z955 Presence of coronary angioplasty implant and graft: Secondary | ICD-10-CM | POA: Diagnosis not present

## 2021-02-28 DIAGNOSIS — I2121 ST elevation (STEMI) myocardial infarction involving left circumflex coronary artery: Secondary | ICD-10-CM

## 2021-02-28 NOTE — Progress Notes (Signed)
Daily Session Note  Patient Details  Name: Austin Tucker MRN: [id number] Date of Birth: 04/19/1956 Referring Provider:   Flowsheet Row CARDIAC REHAB PHASE II ORIENTATION from 12/28/2020 in [place] [facility]  Referring Provider Dr. Smith      Encounter Date: 02/28/2021  Check In:  Session Check In - 02/28/21 0815      Check-In   Supervising physician immediately available to respond to emergencies [facility] MD immediately available    Physician(s) Dr. McDowell    Location [facility]    Staff Present Madison Karch, MS, Exercise Physiologist;Dalton Fletcher, MS, ACSM-CEP, Exercise Physiologist    Virtual Visit No    Medication changes reported     No    Fall or balance concerns reported    No    Tobacco Cessation No Change    Warm-up and Cool-down Performed as group-led instruction    Resistance Training Performed Yes    VAD Patient? No    PAD/SET Patient? No      Pain Assessment   Currently in Pain? No/denies    Pain Score 0-No pain    Multiple Pain Sites No           Capillary Blood Glucose: No results found for this or any previous visit (from the past 24 hour(s)).    Social History   Tobacco Use  Smoking Status Former Smoker  Smokeless Tobacco Never Used    Goals Met:  Independence with exercise equipment Exercise tolerated well No report of cardiac concerns or symptoms Strength training completed today  Goals Unmet:  Not Applicable  Comments: check out 0915   Dr. Jehanzeb Memon is Medical Director for [place] Pul[facility] Re[facility]. 

## 2021-03-01 DIAGNOSIS — E1165 Type 2 diabetes mellitus with hyperglycemia: Secondary | ICD-10-CM | POA: Diagnosis not present

## 2021-03-02 ENCOUNTER — Other Ambulatory Visit: Payer: Self-pay

## 2021-03-02 ENCOUNTER — Encounter (HOSPITAL_COMMUNITY)
Admission: RE | Admit: 2021-03-02 | Discharge: 2021-03-02 | Disposition: A | Discharge status: Home / Self Care | Payer: Medicare Other | Source: Ambulatory Visit | Attending: Interventional Cardiology | Admitting: Interventional Cardiology

## 2021-03-02 DIAGNOSIS — Z955 Presence of coronary angioplasty implant and graft: Secondary | ICD-10-CM | POA: Insufficient documentation

## 2021-03-02 DIAGNOSIS — I2121 ST elevation (STEMI) myocardial infarction involving left circumflex coronary artery: Secondary | ICD-10-CM | POA: Diagnosis not present

## 2021-03-02 NOTE — Progress Notes (Signed)
Daily Session Note  Patient Details  Name: Austin Tucker MRN: [id number] Date of Birth: 07/13/1956 Referring Provider:   Flowsheet Row CARDIAC REHAB PHASE II ORIENTATION from 12/28/2020 in [place] CARDIAC RE[facility]ABILITATION  Referring Provider Dr. Smith      Encounter Date: 03/02/2021  Check In:  Session Check In - 03/02/21 0815      Check-In   Supervising physician immediately available to respond to emergencies [facility] MD immediately available    Physician(s) Dr. Branch    Location [facility]    Staff Present Madison Karch, MS, Exercise Physiologist;Dalton Fletcher, MS, ACSM-CEP, Exercise Physiologist    Virtual Visit No    Medication changes reported     No    Fall or balance concerns reported    No    Tobacco Cessation No Change    Warm-up and Cool-down Performed as group-led instruction    Resistance Training Performed Yes    VAD Patient? No    PAD/SET Patient? No      Pain Assessment   Currently in Pain? No/denies    Pain Score 0-No pain    Multiple Pain Sites No           Capillary Blood Glucose: No results found for this or any previous visit (from the past 24 hour(s)).    Social History   Tobacco Use  Smoking Status Former Smoker  Smokeless Tobacco Never Used    Goals Met:  Independence with exercise equipment Exercise tolerated well No report of cardiac concerns or symptoms Strength training completed today  Goals Unmet:  Not Applicable  Comments: check out 0915   Dr. Jehanzeb Memon is Medical Director for [facility] Pulmonary Rehab. 

## 2021-03-05 ENCOUNTER — Encounter (HOSPITAL_COMMUNITY)
Admission: RE | Admit: 2021-03-05 | Discharge: 2021-03-05 | Disposition: A | Discharge status: Home / Self Care | Payer: Medicare Other | Source: Ambulatory Visit | Attending: Interventional Cardiology | Admitting: Interventional Cardiology

## 2021-03-05 ENCOUNTER — Other Ambulatory Visit: Payer: Self-pay

## 2021-03-05 VITALS — Wt 132.9 lb

## 2021-03-05 DIAGNOSIS — I2121 ST elevation (STEMI) myocardial infarction involving left circumflex coronary artery: Secondary | ICD-10-CM

## 2021-03-05 DIAGNOSIS — Z955 Presence of coronary angioplasty implant and graft: Secondary | ICD-10-CM

## 2021-03-05 NOTE — Progress Notes (Signed)
Daily Session Note  Patient Details  Name: Austin Tucker MRN: [id number] Date of Birth: [birth year] Referring Provider:   Flowsheet Row CARDIAC REHAB PHASE II ORIENTATION from 12/28/2020 in [place]  Referring Provider Dr. Tamala Julian      Encounter Date: 03/05/2021  Check In:  Session Check In - 03/05/21 0815      Check-In   Supervising physician immediately available to respond to emergencies [facility] MD immediately available    Physician(s) Dr. Harl Bowie    Location [facility]    Staff Present Cathren Harsh, MS, Exercise Physiologist;Dalton Kris Mouton, MS, ACSM-CEP, Exercise Physiologist    Virtual Visit No    Medication changes reported     No    Fall or balance concerns reported    No    Tobacco Cessation No Change    Warm-up and Cool-down Performed as group-led instruction    Resistance Training Performed Yes    VAD Patient? No    PAD/SET Patient? No      Pain Assessment   Currently in Pain? No/denies    Pain Score 0-No pain    Multiple Pain Sites No           Capillary Blood Glucose: No results found for this or any previous visit (from the past 24 hour(s)).    Social History   Tobacco Use  Smoking Status Former Smoker  Smokeless Tobacco Never Used    Goals Met:  Independence with exercise equipment Exercise tolerated well No report of cardiac concerns or symptoms Strength training completed today  Goals Unmet:  Not Applicable  Comments: check out 0915   Dr. Kathie Dike is Medical Director for [facility] Pulmonary Rehab.

## 2021-03-07 ENCOUNTER — Encounter (HOSPITAL_COMMUNITY)
Admission: RE | Admit: 2021-03-07 | Discharge: 2021-03-07 | Disposition: A | Discharge status: Home / Self Care | Payer: Medicare Other | Source: Ambulatory Visit | Attending: Interventional Cardiology | Admitting: Interventional Cardiology

## 2021-03-07 ENCOUNTER — Other Ambulatory Visit: Payer: Self-pay

## 2021-03-07 DIAGNOSIS — I2121 ST elevation (STEMI) myocardial infarction involving left circumflex coronary artery: Secondary | ICD-10-CM | POA: Diagnosis not present

## 2021-03-07 DIAGNOSIS — Z299 Encounter for prophylactic measures, unspecified: Secondary | ICD-10-CM | POA: Diagnosis not present

## 2021-03-07 DIAGNOSIS — Z955 Presence of coronary angioplasty implant and graft: Secondary | ICD-10-CM

## 2021-03-07 DIAGNOSIS — D489 Neoplasm of uncertain behavior, unspecified: Secondary | ICD-10-CM | POA: Diagnosis not present

## 2021-03-07 DIAGNOSIS — I10 Essential (primary) hypertension: Secondary | ICD-10-CM | POA: Diagnosis not present

## 2021-03-07 DIAGNOSIS — E1165 Type 2 diabetes mellitus with hyperglycemia: Secondary | ICD-10-CM | POA: Diagnosis not present

## 2021-03-07 NOTE — Progress Notes (Signed)
Daily Session Note  Patient Details  Name: Austin Tucker MRN: [id number] Date of Birth: [birth year] Referring Provider:   Flowsheet Row CARDIAC REHAB PHASE II ORIENTATION from 12/28/2020 in [facility]  Referring Provider Dr. Tamala Julian      Encounter Date: 03/07/2021  Check In:  Session Check In - 03/07/21 0815      Check-In   Supervising physician immediately available to respond to emergencies [facility] MD immediately available    Physician(s) Dr. Harl Bowie    Location [facility]    Staff Present Cathren Harsh, MS, Exercise Physiologist;Dalton Kris Mouton, MS, ACSM-CEP, Exercise Physiologist    Virtual Visit No    Medication changes reported     No    Fall or balance concerns reported    No    Tobacco Cessation No Change    Warm-up and Cool-down Performed as group-led instruction    Resistance Training Performed Yes    VAD Patient? No    PAD/SET Patient? No      Pain Assessment   Currently in Pain? No/denies    Pain Score 0-No pain    Multiple Pain Sites No           Capillary Blood Glucose: No results found for this or any previous visit (from the past 24 hour(s)).    Social History   Tobacco Use  Smoking Status Former Smoker  Smokeless Tobacco Never Used    Goals Met:  Independence with exercise equipment Exercise tolerated well No report of cardiac concerns or symptoms Strength training completed today  Goals Unmet:  Not Applicable  Comments: check out 0915   Dr. Kathie Dike is Medical Director for [facility].

## 2021-03-07 NOTE — Progress Notes (Signed)
Cardiac Individual Treatment Plan  Patient Details  Name: Austin Tucker MRN: [id number] Date of Birth: May 07, 1956 Referring Provider:   Flowsheet Row CARDIAC REHAB PHASE II ORIENTATION from 12/28/2020 in [facility] [facility]  Referring Provider Dr. 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  aspirin 81 MG chewable tablet, Chew 1 tablet (81 mg total) by mouth daily., Disp: 90 tablet, Rfl: 3 .  atorvastatin (LIPITOR) 40 MG tablet, Take 1 tablet (40 mg total) by mouth daily., Disp: 90 tablet, Rfl: 1 .  losartan (COZAAR) 25 MG tablet, Take 25 mg by mouth daily., Disp: , Rfl:  .  metFORMIN (GLUCOPHAGE-XR) 500 MG 24 hr tablet, Take 500 mg by mouth daily with breakfast., Disp: , Rfl:  .  metoprolol tartrate (LOPRESSOR) 25 MG tablet, Take 1 tablet (25 mg total) by mouth 2 (two) times daily., Disp: 60 tablet, Rfl: 4 .  nitroGLYCERIN (NITROSTAT) 0.4 MG SL tablet, Place 1 tablet (0.4 mg total) under the tongue every 5 (five) minutes as needed for chest pain., Disp: 25 tablet, Rfl: 1 .  ticagrelor (BRILINTA) 90 MG TABS tablet, Take 1 tablet (90 mg total) by mouth 2 (two) times daily., Disp: 180 tablet, Rfl: 3  Past Medical History: No past medical history on file.  Tobacco Use: Social History   Tobacco Use  Smoking Status Former Smoker  Smokeless Tobacco Never Used    Labs: Recent [employer] for ITP Cardiac and Pulmonary Rehab Latest Ref Rng & Units 11/29/2020 11/29/2020 11/29/2020 11/30/2020   Cholestrol 0 - 200 mg/dL 127 - 118 -   LDLCALC 0 - 99 mg/dL 79 - 56 -   HDL >40 mg/dL 24(L) - 21(L) -   Trlycerides <150 mg/dL 119  - 206(H) -   Hemoglobin A1c 4.8 - 5.6 % 7.8(H) - - 8.0(H)   TCO2 22 - 32 mmol/L - 20(L) - -      Capillary Blood Glucose: Lab Results  Component Value Date   GLUCAP 153 (H) 12/01/2020   GLUCAP 152 (H) 12/01/2020   GLUCAP 132 (H) 11/30/2020   GLUCAP 187 (H) 11/30/2020   GLUCAP 139 (H) 11/30/2020     Exercise Target Goals: Exercise Program Goal: Individual exercise prescription set using results from initial 6 min walk test and THRR while considering  patient's activity barriers and safety.   Exercise Prescription Goal: Starting with aerobic activity 30 plus minutes a day, 3 days per week for initial exercise prescription. Provide home exercise prescription and guidelines that participant acknowledges understanding prior to discharge.  Activity Barriers & Risk Stratification:  Activity Barriers & Cardiac Risk Stratification - 12/28/20 0922      Activity Barriers & Cardiac Risk Stratification   Activity Barriers None    Cardiac Risk Stratification High           6 Minute Walk:  6 Minute Walk    Row Name 12/28/20 1018         6 Minute Walk   Phase Initial     Distance 1400 feet     Walk Time 6 minutes     # of Rest Breaks 0     MPH  2.7     METS 3.18     RPE 8     VO2 Peak 11.15     Symptoms No     Resting HR 76 bpm     Resting BP 102/58     Resting Oxygen Saturation  99 %     Exercise Oxygen Saturation  during 6 min walk 100 %     Max Ex. HR 80 bpm     Max Ex. BP 112/62     2 Minute Post BP 110/60            Oxygen Initial Assessment:   Oxygen Re-Evaluation:   Oxygen Discharge (Final Oxygen Re-Evaluation):   Initial Exercise Prescription:  Initial Exercise Prescription - 12/28/20 1000      Date of Initial Exercise RX and Referring Provider   Date 12/28/20    Referring Provider Dr. Tamala Julian    Expected Discharge Date 03/16/21      NuStep   Level 1    SPM 60    Minutes 39      Intensity   THRR 40-80% of Max Heartrate 62-125    Ratings of  Perceived Exertion 11-15      Resistance Training   Training Prescription Yes    Weight 2    Reps 10-15           Perform Capillary Blood Glucose checks as needed.  Exercise Prescription Changes:   Exercise Prescription Changes    Row Name 01/08/21 1000 01/12/21 1100 01/22/21 1000 02/05/21 1000 02/19/21 0900     Response to Exercise   Blood Pressure (Admit) 116/68 -- 128/74 112/64 122/74   Blood Pressure (Exercise) 138/72 -- 164/70 150/58 148/70   Blood Pressure (Exit) 108/62 -- 120/64 112/68 108/70   Heart Rate (Admit) 83 bpm -- 72 bpm 73 bpm 89 bpm   Heart Rate (Exercise) 98 bpm -- 95 bpm 98 bpm 124 bpm   Heart Rate (Exit) 90 bpm -- 81 bpm 74 bpm 97 bpm   Rating of Perceived Exertion (Exercise) 13 -- 12 11 11    Duration Continue with 30 min of aerobic exercise without signs/symptoms of physical distress. -- Continue with 30 min of aerobic exercise without signs/symptoms of physical distress. Continue with 30 min of aerobic exercise without signs/symptoms of physical distress. Continue with 30 min of aerobic exercise without signs/symptoms of physical distress.   Intensity THRR unchanged -- THRR unchanged THRR unchanged THRR unchanged     Progression   Progression Continue to progress workloads to maintain intensity without signs/symptoms of physical distress. -- Continue to progress workloads to maintain intensity without signs/symptoms of physical distress. Continue to progress workloads to maintain intensity without signs/symptoms of physical distress. Continue to progress workloads to maintain intensity without signs/symptoms of physical distress.     Resistance Training   Training Prescription Yes -- Yes Yes Yes   Weight 2 lbs -- 2 lbs 3 lbs 3 lbs   Reps 10-15 -- 10-15 10-15 10-15   Time 10 Minutes -- 10 Minutes 10 Minutes 10 Minutes     NuStep   Level 1 -- 2 3 3    SPM 89 -- 112 100 98   Minutes 39 -- 39 39 39   METs 2 -- 2.1 2.2 2.4     Home Exercise Plan   Plans  to continue exercise at -- Home (comment) -- -- --   Frequency -- Add 2 additional days to program exercise sessions. -- -- --   Initial  Home Exercises Provided -- 01/12/21 -- -- --   [place] Name 03/05/21 1000             Response to Exercise   Blood Pressure (Admit) 120/64       Blood Pressure (Exercise) 156/70       Blood Pressure (Exit) 126/68       Heart Rate (Admit) 74 bpm       Heart Rate (Exercise) 115 bpm       Heart Rate (Exit) 80 bpm       Rating of Perceived Exertion (Exercise) 12       Duration Continue with 30 min of aerobic exercise without signs/symptoms of physical distress.       Intensity THRR unchanged               Progression   Progression Continue to progress workloads to maintain intensity without signs/symptoms of physical distress.               Resistance Training   Training Prescription Yes       Weight 4 lbs       Reps 10-15       Time 10 Minutes               NuStep   Level 4       SPM 97       Minutes 39       METs 2.5              Exercise Comments:   Exercise Comments    Row Name 01/12/21 1158           Exercise Comments reviewed home exercise with patient. He is not currently exercising at home. We discussed starting to walk at home 2 days per week outside of rehab.              Exercise Goals and Review:   Exercise Goals    Row Name 12/28/20 1020 01/08/21 1038 02/05/21 1030 03/05/21 1030       Exercise Goals   Increase Physical Activity Yes Yes Yes Yes    Intervention Provide advice, education, support and counseling about physical activity/exercise needs.;Develop an individualized exercise prescription for aerobic and resistive training based on initial evaluation findings, risk stratification, comorbidities and participant's personal goals. Provide advice, education, support and counseling about physical activity/exercise needs.;Develop an individualized exercise prescription for aerobic and resistive training based on  initial evaluation findings, risk stratification, comorbidities and participant's personal goals. Provide advice, education, support and counseling about physical activity/exercise needs.;Develop an individualized exercise prescription for aerobic and resistive training based on initial evaluation findings, risk stratification, comorbidities and participant's personal goals. Provide advice, education, support and counseling about physical activity/exercise needs.;Develop an individualized exercise prescription for aerobic and resistive training based on initial evaluation findings, risk stratification, comorbidities and participant's personal goals.    Expected Outcomes Short Term: Attend rehab on a regular basis to increase amount of physical activity.;Long Term: Add in home exercise to make exercise part of routine and to increase amount of physical activity.;Long Term: Exercising regularly at least 3-5 days a week. Short Term: Attend rehab on a regular basis to increase amount of physical activity.;Long Term: Add in home exercise to make exercise part of routine and to increase amount of physical activity.;Long Term: Exercising regularly at least 3-5 days a week. Short Term: Attend rehab on a regular basis to increase amount of physical activity.;Long Term: Add in home exercise to make  exercise part of routine and to increase amount of physical activity.;Long Term: Exercising regularly at least 3-5 days a week. Short Term: Attend rehab on a regular basis to increase amount of physical activity.;Long Term: Add in home exercise to make exercise part of routine and to increase amount of physical activity.;Long Term: Exercising regularly at least 3-5 days a week.    Increase Strength and Stamina Yes Yes Yes Yes    Intervention Develop an individualized exercise prescription for aerobic and resistive training based on initial evaluation findings, risk stratification, comorbidities and participant's personal  goals.;Provide advice, education, support and counseling about physical activity/exercise needs. Develop an individualized exercise prescription for aerobic and resistive training based on initial evaluation findings, risk stratification, comorbidities and participant's personal goals.;Provide advice, education, support and counseling about physical activity/exercise needs. Develop an individualized exercise prescription for aerobic and resistive training based on initial evaluation findings, risk stratification, comorbidities and participant's personal goals.;Provide advice, education, support and counseling about physical activity/exercise needs. Develop an individualized exercise prescription for aerobic and resistive training based on initial evaluation findings, risk stratification, comorbidities and participant's personal goals.;Provide advice, education, support and counseling about physical activity/exercise needs.    Expected Outcomes Short Term: Increase workloads from initial exercise prescription for resistance, speed, and METs.;Short Term: Perform resistance training exercises routinely during rehab and add in resistance training at home;Long Term: Improve cardiorespiratory fitness, muscular endurance and strength as measured by increased METs and functional capacity (6MWT) Short Term: Increase workloads from initial exercise prescription for resistance, speed, and METs.;Short Term: Perform resistance training exercises routinely during rehab and add in resistance training at home;Long Term: Improve cardiorespiratory fitness, muscular endurance and strength as measured by increased METs and functional capacity (6MWT) Short Term: Increase workloads from initial exercise prescription for resistance, speed, and METs.;Short Term: Perform resistance training exercises routinely during rehab and add in resistance training at home;Long Term: Improve cardiorespiratory fitness, muscular endurance and strength  as measured by increased METs and functional capacity (6MWT) Short Term: Increase workloads from initial exercise prescription for resistance, speed, and METs.;Short Term: Perform resistance training exercises routinely during rehab and add in resistance training at home;Long Term: Improve cardiorespiratory fitness, muscular endurance and strength as measured by increased METs and functional capacity (6MWT)    Able to understand and use rate of perceived exertion (RPE) scale Yes Yes Yes Yes    Intervention Provide education and explanation on how to use RPE scale Provide education and explanation on how to use RPE scale Provide education and explanation on how to use RPE scale Provide education and explanation on how to use RPE scale    Expected Outcomes Short Term: Able to use RPE daily in rehab to express subjective intensity level;Long Term:  Able to use RPE to guide intensity level when exercising independently Short Term: Able to use RPE daily in rehab to express subjective intensity level;Long Term:  Able to use RPE to guide intensity level when exercising independently Short Term: Able to use RPE daily in rehab to express subjective intensity level;Long Term:  Able to use RPE to guide intensity level when exercising independently Short Term: Able to use RPE daily in rehab to express subjective intensity level;Long Term:  Able to use RPE to guide intensity level when exercising independently    Knowledge and understanding of Target Heart Rate Range (THRR) Yes Yes Yes Yes    Intervention Provide education and explanation of THRR including how the numbers were predicted and where they are located for  reference Provide education and explanation of THRR including how the numbers were predicted and where they are located for reference Provide education and explanation of THRR including how the numbers were predicted and where they are located for reference Provide education and explanation of THRR including  how the numbers were predicted and where they are located for reference    Expected Outcomes Short Term: Able to state/look up THRR;Long Term: Able to use THRR to govern intensity when exercising independently;Short Term: Able to use daily as guideline for intensity in rehab Short Term: Able to state/look up THRR;Long Term: Able to use THRR to govern intensity when exercising independently;Short Term: Able to use daily as guideline for intensity in rehab Short Term: Able to state/look up THRR;Long Term: Able to use THRR to govern intensity when exercising independently;Short Term: Able to use daily as guideline for intensity in rehab Short Term: Able to state/look up THRR;Long Term: Able to use THRR to govern intensity when exercising independently;Short Term: Able to use daily as guideline for intensity in rehab    Able to check pulse independently Yes Yes Yes Yes    Intervention Provide education and demonstration on how to check pulse in carotid and radial arteries.;Review the importance of being able to check your own pulse for safety during independent exercise -- Provide education and demonstration on how to check pulse in carotid and radial arteries.;Review the importance of being able to check your own pulse for safety during independent exercise Provide education and demonstration on how to check pulse in carotid and radial arteries.;Review the importance of being able to check your own pulse for safety during independent exercise    Expected Outcomes Short Term: Able to explain why pulse checking is important during independent exercise;Long Term: Able to check pulse independently and accurately Short Term: Able to explain why pulse checking is important during independent exercise;Long Term: Able to check pulse independently and accurately Short Term: Able to explain why pulse checking is important during independent exercise;Long Term: Able to check pulse independently and accurately Short Term: Able  to explain why pulse checking is important during independent exercise;Long Term: Able to check pulse independently and accurately    Understanding of Exercise Prescription Yes Yes Yes Yes    Intervention Provide education, explanation, and written materials on patient's individual exercise prescription Provide education, explanation, and written materials on patient's individual exercise prescription Provide education, explanation, and written materials on patient's individual exercise prescription Provide education, explanation, and written materials on patient's individual exercise prescription    Expected Outcomes Short Term: Able to explain program exercise prescription;Long Term: Able to explain home exercise prescription to exercise independently Short Term: Able to explain program exercise prescription;Long Term: Able to explain home exercise prescription to exercise independently Short Term: Able to explain program exercise prescription;Long Term: Able to explain home exercise prescription to exercise independently Short Term: Able to explain program exercise prescription;Long Term: Able to explain home exercise prescription to exercise independently           Exercise Goals Re-Evaluation :  Exercise Goals Re-Evaluation    Row Name 01/08/21 1039 02/05/21 1031 03/05/21 1030         Exercise Goal Re-Evaluation   Exercise Goals Review Increase Physical Activity;Increase Strength and Stamina;Able to understand and use rate of perceived exertion (RPE) scale;Knowledge and understanding of Target Heart Rate Range (THRR);Able to check pulse independently;Understanding of Exercise Prescription Increase Physical Activity;Increase Strength and Stamina;Able to understand and use rate of perceived exertion (  RPE) scale;Knowledge and understanding of Target Heart Rate Range (THRR);Able to check pulse independently;Understanding of Exercise Prescription Increase Physical Activity;Increase Strength and  Stamina;Able to understand and use rate of perceived exertion (RPE) scale;Knowledge and understanding of Target Heart Rate Range (THRR);Able to check pulse independently;Understanding of Exercise Prescription     Comments Patient has completed 4 exercise sessions. He is tolerating exercise well and increasing his spm on the NuStep quickly. He is catching on to the program routine very well and continues to improve. He has a very positive attitute about rehab and enjoys coming. He is currently exercising at 2.0 METs on the NuStep. Will continue to progress as able. patient has completed 16 exercise sessions. He is tolerating exercise well and increasing his intensities. He is improving by increasing his strength as well as his endurance. He says that he is getting stronger and he can tell that the program is helping him in his daily life. He is exercising at home and continues to do well. He is currently exercising at 2.2 METs on the NuStep. Will continue to monitor and progress as able. Patient has completed 28 exercise sessions. He has progressed well with exercise. He has increased his intensities on the NuStep to level 4 and is maintaining a good pace on that. He has increased his resistance training weights to 4lbs. He feels he is getting stronger and is improving his ADLs. He is exercising, walking, at home 3 days per week outside of rehab for 30 min per day. He is currently exercising at 2.5 METs on the Nustep. Will continue to monitor and progress as able.     Expected Outcomes Through exercise at rehab and a home exercise program , patient will achieve their goals. Through exercise at rehab and a home exercise program , patient will achieve their goals. Through exercise at rehab and a home exercise program , patient will achieve their goals.             Discharge Exercise Prescription (Final Exercise Prescription Changes):  Exercise Prescription Changes - 03/05/21 1000      Response to Exercise    Blood Pressure (Admit) 120/64    Blood Pressure (Exercise) 156/70    Blood Pressure (Exit) 126/68    Heart Rate (Admit) 74 bpm    Heart Rate (Exercise) 115 bpm    Heart Rate (Exit) 80 bpm    Rating of Perceived Exertion (Exercise) 12    Duration Continue with 30 min of aerobic exercise without signs/symptoms of physical distress.    Intensity THRR unchanged      Progression   Progression Continue to progress workloads to maintain intensity without signs/symptoms of physical distress.      Resistance Training   Training Prescription Yes    Weight 4 lbs    Reps 10-15    Time 10 Minutes      NuStep   Level 4    SPM 97    Minutes 39    METs 2.5           Nutrition:  Target Goals: Understanding of nutrition guidelines, daily intake of sodium 1500mg , cholesterol 200mg , calories 30% from fat and 7% or less from saturated fats, daily to have 5 or more servings of fruits and vegetables.  Biometrics:  Pre Biometrics - 03/05/21 1029      Pre Biometrics   Weight 60.3 kg            Nutrition Therapy Plan and Nutrition Goals:  Nutrition Therapy &  Goals - 01/29/21 0942      Personal Nutrition Goals   Comments We will continue to provide heart health nutritional education through handouts.      Intervention Plan   Intervention Nutrition handout(s) given to patient.           Nutrition Assessments:  Nutrition Assessments - 12/28/20 1042      MEDFICTS Scores   Pre Score 33          MEDIFICTS Score Key:  ?70 Need to make dietary changes   40-70 Heart Healthy Diet  ? 40 Therapeutic Level Cholesterol Diet   Picture Your Plate Scores:  <88 Unhealthy dietary pattern with much room for improvement.  41-50 Dietary pattern unlikely to meet recommendations for good health and room for improvement.  51-60 More healthful dietary pattern, with some room for improvement.   >60 Healthy dietary pattern, although there may be some specific behaviors that could be  improved.    Nutrition Goals Re-Evaluation:   Nutrition Goals Discharge (Final Nutrition Goals Re-Evaluation):   Psychosocial: Target Goals: Acknowledge presence or absence of significant depression and/or stress, maximize coping skills, provide positive support system. Participant is able to verbalize types and ability to use techniques and skills needed for reducing stress and depression.  Initial Review & Psychosocial Screening:  Initial Psych Review & Screening - 12/28/20 1033      Initial Review   Current issues with None Identified      Family Dynamics   Good Support System? Yes    Comments Patient lives with his wife of many years. He has one daughter that lives in [place] that he is close with.  He says his wife is his support system and his chruch family. He denies any depression, stress, or anxiety. He says he is excited about starting cardiac rehab. He demonstrates a very positive outlook and attitude regarding his furture and health.      Barriers   Psychosocial barriers to participate in program Psychosocial barriers identified (see note)      Screening Interventions   Interventions Encouraged to exercise;Program counselor consult    Expected Outcomes Short Term goal: Identification and review with participant of any Quality of Life or Depression concerns found by scoring the questionnaire.           Quality of Life Scores:  Quality of Life - 12/28/20 1022      Quality of Life   Select Quality of Life      Quality of Life Scores   Health/Function Pre 18.8 %    Socioeconomic Pre 22.58 %    Psych/Spiritual Pre 21.79 %    Family Pre 19.8 %    GLOBAL Pre 20.27 %          Scores of 19 and below usually indicate a poorer quality of life in these areas.  A difference of  2-3 points is a clinically meaningful difference.  A difference of 2-3 points in the total score of the Quality of Life Index has been associated with significant improvement in overall quality  of life, self-image, physical symptoms, and general health in studies assessing change in quality of life.  PHQ-9: Recent Review Flowsheet Data    Depression screen [facility] 2/9 12/28/2020   Decreased Interest 0   Down, Depressed, Hopeless 0   PHQ - 2 Score 0   Altered sleeping 1    Tired, decreased energy 0   Change in appetite 0   Feeling bad or failure about  yourself  0   Trouble concentrating 0   Moving slowly or fidgety/restless 0   Suicidal thoughts 0   PHQ-9 Score 1   Difficult doing work/chores Not difficult at all     Interpretation of Total Score  Total Score Depression Severity:  1-4 = Minimal depression, 5-9 = Mild depression, 10-14 = Moderate depression, 15-19 = Moderately severe depression, 20-27 = Severe depression   Psychosocial Evaluation and Intervention:  Psychosocial Evaluation - 12/28/20 1036      Psychosocial Evaluation & Interventions   Interventions Stress management education;Relaxation education;Encouraged to exercise with the program and follow exercise prescription    Comments Patient has no psychosocial issues identified at his orientation visit. His QOL score was 20.27 overall scoring lowest in health/function. His PHQ-9 socre was 1. Will continue to monitor.    Expected Outcomes Patient will have no psychosocial issues identified at discharge.    Continue Psychosocial Services  No Follow up required           Psychosocial Re-Evaluation:  Psychosocial Re-Evaluation    [place] Name 01/01/21 1402 01/29/21 8546 02/26/21 0953         Psychosocial Re-Evaluation   Current issues with None Identified None Identified None Identified     Comments Patient is new to the program completing 2 sessions. He has no psychosocial issues identified. He is excited to start the program and looks forward to exercising. We will continue to monitor. Patient is new to the program completing 14 sessions. He has no psychosocial issues identified. Patient seems to enjoy coming to  the program and is always smiling. He continues to demonstrate a very positive attitude and is very interactive with staff.  We will continue to monitor. Patient has completed 25 sessions. He continues to have no psychosocial issues identified. Patient continues to enjoy coming to the program and is always smiling. He continues to demonstrate a very positive attitude and is very interactive with staff.  We will continue to monitor.     Expected Outcomes Patient will have no psychosocial issues identified at discharge. Patient will have no psychosocial issues identified at discharge. Patient will have no psychosocial issues identified at discharge.     Interventions Stress management education;Encouraged to attend Cardiac Rehabilitation for the exercise;Relaxation education Stress management education;Encouraged to attend Cardiac Rehabilitation for the exercise;Relaxation education Stress management education;Encouraged to attend Cardiac Rehabilitation for the exercise;Relaxation education     Continue Psychosocial Services  No Follow up required No Follow up required No Follow up required            Psychosocial Discharge (Final Psychosocial Re-Evaluation):  Psychosocial Re-Evaluation - 02/26/21 0953      Psychosocial Re-Evaluation   Current issues with None Identified    Comments Patient has completed 25 sessions. He continues to have no psychosocial issues identified. Patient continues to enjoy coming to the program and is always smiling. He continues to demonstrate a very positive attitude and is very interactive with staff.  We will continue to monitor.    Expected Outcomes Patient will have no psychosocial issues identified at discharge.    Interventions Stress management education;Encouraged to attend Cardiac Rehabilitation for the exercise;Relaxation education    Continue Psychosocial Services  No Follow up required           Vocational Rehabilitation: Provide vocational rehab  assistance to qualifying candidates.   Vocational Rehab Evaluation & Intervention:  Vocational Rehab - 12/28/20 1043      Initial Vocational Rehab Evaluation &  Intervention   Assessment shows need for Vocational Rehabilitation No      Vocational Rehab Re-Evaulation   Comments Patient is retired and does not need vocational rehab.           Education: Education Goals: Education classes will be provided on a weekly basis, covering required topics. Participant will state understanding/return demonstration of topics presented.  Learning Barriers/Preferences:  Learning Barriers/Preferences - 12/28/20 1042      Learning Barriers/Preferences   Learning Barriers None    Learning Preferences Video;Written Material;Audio           Education Topics: Hypertension, Hypertension Reduction -Define heart disease and high blood pressure. Discus how high blood pressure affects the body and ways to reduce high blood pressure.   Exercise and Your Heart -Discuss why it is important to exercise, the FITT principles of exercise, normal and abnormal responses to exercise, and how to exercise safely.   Angina -Discuss definition of angina, causes of angina, treatment of angina, and how to decrease risk of having angina.   Cardiac Medications -Review what the following cardiac medications are used for, how they affect the body, and side effects that may occur when taking the medications.  Medications include Aspirin, Beta blockers, calcium channel blockers, ACE Inhibitors, angiotensin receptor blockers, diuretics, digoxin, and antihyperlipidemics.   Congestive Heart Failure -Discuss the definition of CHF, how to live with CHF, the signs and symptoms of CHF, and how keep track of weight and sodium intake. Flowsheet Row CARDIAC REHAB PHASE II EXERCISE from 02/28/2021 in [place]  Date 01/03/21  Educator Mk  Instruction Review Code 2- Demonstrated Understanding       Heart Disease and Intimacy -Discus the effect sexual activity has on the heart, how changes occur during intimacy as we age, and safety during sexual activity. Flowsheet Row CARDIAC REHAB PHASE II EXERCISE from 02/28/2021 in [place]  Date 01/10/21  Educator DF  Instruction Review Code 2- Demonstrated Understanding      Smoking Cessation / COPD -Discuss different methods to quit smoking, the health benefits of quitting smoking, and the definition of COPD. Flowsheet Row CARDIAC REHAB PHASE II EXERCISE from 02/28/2021 in [place]  Date 01/17/21  Educator DF  Instruction Review Code 2- Demonstrated Understanding      Nutrition I: Fats -Discuss the types of cholesterol, what cholesterol does to the heart, and how cholesterol levels can be controlled. Flowsheet Row CARDIAC REHAB PHASE II EXERCISE from 02/28/2021 in [place]  Date 01/24/21  Educator mk  Instruction Review Code 2- Demonstrated Understanding      Nutrition II: Labels -Discuss the different components of food labels and how to read food label   Heart Parts/Heart Disease and PAD -Discuss the anatomy of the heart, the pathway of blood circulation through the heart, and these are affected by heart disease.   Stress I: Signs and Symptoms -Discuss the causes of stress, how stress may lead to anxiety and depression, and ways to limit stress. Flowsheet Row CARDIAC REHAB PHASE II EXERCISE from 02/28/2021 in [place]  Date 02/14/21  Educator mk  Instruction Review Code 2- Demonstrated Understanding      Stress II: Relaxation -Discuss different types of relaxation techniques to limit stress. Flowsheet Row CARDIAC REHAB PHASE II EXERCISE from 02/28/2021 in [place]  Date 02/21/21  Educator mk  Instruction Review Code 2- Demonstrated Understanding      Warning Signs of Stroke /  TIA -Discuss  definition of a stroke, what the signs and symptoms are of a stroke, and how to identify when someone is having stroke. Flowsheet Row CARDIAC REHAB PHASE II EXERCISE from 02/28/2021 in [place]  Date 02/28/21  Educator mk  Instruction Review Code 2- Demonstrated Understanding      Knowledge Questionnaire Score:  Knowledge Questionnaire Score - 12/28/20 1043      Knowledge Questionnaire Score   Pre Score 24/28           Core Components/Risk Factors/Patient Goals at Admission:  Personal Goals and Risk Factors at Admission - 12/28/20 1045      Core Components/Risk Factors/Patient Goals on Admission    Weight Management Weight Maintenance    Diabetes Yes    Intervention Provide education about signs/symptoms and action to take for hypo/hyperglycemia.;Provide education about proper nutrition, including hydration, and aerobic/resistive exercise prescription along with prescribed medications to achieve blood glucose in normal ranges: Fasting glucose 65-99 mg/dL    Expected Outcomes Short Term: Participant verbalizes understanding of the signs/symptoms and immediate care of hyper/hypoglycemia, proper foot care and importance of medication, aerobic/resistive exercise and nutrition plan for blood glucose control.;Long Term: Attainment of HbA1C < 7%.    Personal Goal Other Yes    Personal Goal Learn about eating healthier; Get stronger; Get back to doing his ADL's; get healthier overall.    Intervention Provide education and monitored exercise for 12 weeks.    Expected Outcomes Patient will meet both program and personal goals.           Core Components/Risk Factors/Patient Goals Review:   Goals and Risk Factor Review    Row Name 01/01/21 1405 01/29/21 1007 02/26/21 0954         Core Components/Risk Factors/Patient Goals Review   Personal Goals Review Weight Management/Obesity;Other;Diabetes -- Weight Management/Obesity;Other;Diabetes     Review Patient is new  to the program completing 2 sessions. He was referred to cardiac rebab with [facility]. He has multiple risk factors for CAD and is doing the program for risk modification. His last A1C on file was 8.0% on 11/30/20. He is a newly diagnosed diabetic using Metformin and diet for control. His personal goals for the program are to get stronger; be able to return to his ADL's and to learn more about healthy eating and get health overall. We will continue to monitor for progress as he works toward meeting these goals. Patient has completed 14 sessions losing 2 lbs since last 30 day review. He is doing well in the program with progression and consistent attendance. He saw a dermotalogist 2/8 due to chronic psoriasis. MD discontinues methotrexate due to a h/o CHF and added Quantiferon and ilumya 100 gm SQ to manage the psoriasis. Patient's b/p is well controlled. His reported glucose readings are WNL.He is a newly diagnosed Diabetic. His A1C was 8% 11/30/20. He is using Meformin and diet for control. His personal goals for the program are to get stronger; return to his ADL's and to learn about healthy eating and get healthier overall. Patient has completed 25 sessions maintaining his weight since his initial visit. He continues to do well in the program with consistent attandance and progression. His most recent A1C on file was 8.0% 11/21/20. His personal goals for the program are to get stronger; return to his ADL's; get heathier overall and learn about heatlhy eating. We will continue to monitor as he works toward meeting these goals.     Expected Outcomes Patient  will complete the program meeting both personal and program goal.s Patient will complete the program meeting both personal and program goal.s Patient will complete the program meeting both personal and program goal.s            Core Components/Risk Factors/Patient Goals at Discharge (Final Review):   Goals and Risk Factor Review - 02/26/21 0954      Core  Components/Risk Factors/Patient Goals Review   Personal Goals Review Weight Management/Obesity;Other;Diabetes    Review Patient has completed 25 sessions maintaining his weight since his initial visit. He continues to do well in the program with consistent attandance and progression. His most recent A1C on file was 8.0% 11/21/20. His personal goals for the program are to get stronger; return to his ADL's; get heathier overall and learn about heatlhy eating. We will continue to monitor as he works toward meeting these goals.    Expected Outcomes Patient will complete the program meeting both personal and program goal.s           ITP Comments:   Comments: ITP REVIEW Pt is making expected progress toward Cardiac Rehab goals after completing 29 sessions. Recommend continued exercise, life style modification, education, and increased stamina and strength.

## 2021-03-08 DIAGNOSIS — Z87891 Personal history of nicotine dependence: Secondary | ICD-10-CM | POA: Diagnosis not present

## 2021-03-08 DIAGNOSIS — L409 Psoriasis, unspecified: Secondary | ICD-10-CM | POA: Diagnosis not present

## 2021-03-08 DIAGNOSIS — I2119 ST elevation (STEMI) myocardial infarction involving other coronary artery of inferior wall: Secondary | ICD-10-CM | POA: Diagnosis not present

## 2021-03-08 DIAGNOSIS — Z955 Presence of coronary angioplasty implant and graft: Secondary | ICD-10-CM | POA: Diagnosis not present

## 2021-03-08 DIAGNOSIS — E1100 Type 2 diabetes mellitus with hyperosmolarity without nonketotic hyperglycemic-hyperosmolar coma (NKHHC): Secondary | ICD-10-CM | POA: Diagnosis not present

## 2021-03-09 ENCOUNTER — Other Ambulatory Visit: Payer: Self-pay

## 2021-03-09 ENCOUNTER — Encounter (HOSPITAL_COMMUNITY)
Admission: RE | Admit: 2021-03-09 | Discharge: 2021-03-09 | Disposition: A | Discharge status: Home / Self Care | Payer: Medicare Other | Source: Ambulatory Visit | Attending: Interventional Cardiology | Admitting: Interventional Cardiology

## 2021-03-09 DIAGNOSIS — I2121 ST elevation (STEMI) myocardial infarction involving left circumflex coronary artery: Secondary | ICD-10-CM

## 2021-03-09 DIAGNOSIS — Z955 Presence of coronary angioplasty implant and graft: Secondary | ICD-10-CM

## 2021-03-09 NOTE — Progress Notes (Signed)
Daily Session Note  Patient Details  Name: Austin Tucker MRN: [id number] Date of Birth: [birth year] Referring Provider:   Flowsheet Row CARDIAC REHAB PHASE II ORIENTATION from 12/28/2020 in [place]  Referring Provider Dr. Tamala Julian      Encounter Date: 03/09/2021  Check In:  Session Check In - 03/09/21 0815      Check-In   Supervising physician immediately available to respond to emergencies [facility] MD immediately available    Physician(s) Dr. Domenic Polite    Location [facility]    Staff Present Hoy Register, MS, ACSM-CEP, Exercise Physiologist;Debra Wynetta Emery, RN, BSN    Virtual Visit No    Medication changes reported     No    Fall or balance concerns reported    No    Tobacco Cessation No Change    Warm-up and Cool-down Performed as group-led instruction    Resistance Training Performed Yes    VAD Patient? No    PAD/SET Patient? No      Pain Assessment   Currently in Pain? No/denies    Pain Score 0-No pain    Multiple Pain Sites No           Capillary Blood Glucose: No results found for this or any previous visit (from the past 24 hour(s)).    Social History   Tobacco Use  Smoking Status Former Smoker  Smokeless Tobacco Never Used    Goals Met:  Independence with exercise equipment Exercise tolerated well No report of cardiac concerns or symptoms Strength training completed today  Goals Unmet:  Not Applicable  Comments: checkout time is 0915   Dr. Kathie Dike is Medical Director for [facility] Pulmonary Rehab.

## 2021-03-12 ENCOUNTER — Other Ambulatory Visit: Payer: Self-pay

## 2021-03-12 ENCOUNTER — Encounter (HOSPITAL_COMMUNITY)
Admission: RE | Admit: 2021-03-12 | Discharge: 2021-03-12 | Disposition: A | Discharge status: Home / Self Care | Payer: Medicare Other | Source: Ambulatory Visit | Attending: Interventional Cardiology | Admitting: Interventional Cardiology

## 2021-03-12 DIAGNOSIS — I2121 ST elevation (STEMI) myocardial infarction involving left circumflex coronary artery: Secondary | ICD-10-CM | POA: Diagnosis not present

## 2021-03-12 DIAGNOSIS — Z955 Presence of coronary angioplasty implant and graft: Secondary | ICD-10-CM | POA: Diagnosis not present

## 2021-03-12 NOTE — Progress Notes (Signed)
Daily Session Note  Patient Details  Name: Austin Tucker MRN: [id number] Date of Birth: July 21, 1956 Referring Provider:   Flowsheet Row CARDIAC REHAB PHASE II ORIENTATION from 12/28/2020 in [place]  Referring Provider Dr. Tamala Julian      Encounter Date: 03/12/2021  Check In:  Session Check In - 03/12/21 0815      Check-In   Supervising physician immediately available to respond to emergencies [facility] MD immediately available    Physician(s) Dr. Melene Muller    Location [facility]    Staff Present Cathren Harsh, MS, Exercise Physiologist;[place] Kris Mouton, MS, ACSM-CEP, Exercise Physiologist    Virtual Visit No    Medication changes reported     No    Fall or balance concerns reported    No    Tobacco Cessation No Change    Warm-up and Cool-down Performed as group-led instruction    Resistance Training Performed Yes    VAD Patient? No    PAD/SET Patient? No      Pain Assessment   Currently in Pain? No/denies    Pain Score 0-No pain    Multiple Pain Sites No           Capillary Blood Glucose: No results found for this or any previous visit (from the past 24 hour(s)).    Social History   Tobacco Use  Smoking Status Former Smoker  Smokeless Tobacco Never Used    Goals Met:  Independence with exercise equipment Exercise tolerated well No report of cardiac concerns or symptoms Strength training completed today  Goals Unmet:  Not Applicable  Comments: check out 0915   Dr. Kathie Dike is Medical Director for [facility] Pulmonary Rehab.

## 2021-03-14 ENCOUNTER — Other Ambulatory Visit: Payer: Self-pay

## 2021-03-14 ENCOUNTER — Encounter (HOSPITAL_COMMUNITY)
Admission: RE | Admit: 2021-03-14 | Discharge: 2021-03-14 | Disposition: A | Discharge status: Home / Self Care | Payer: Medicare Other | Source: Ambulatory Visit | Attending: Interventional Cardiology | Admitting: Interventional Cardiology

## 2021-03-14 DIAGNOSIS — I2121 ST elevation (STEMI) myocardial infarction involving left circumflex coronary artery: Secondary | ICD-10-CM | POA: Diagnosis not present

## 2021-03-14 DIAGNOSIS — Z955 Presence of coronary angioplasty implant and graft: Secondary | ICD-10-CM | POA: Diagnosis not present

## 2021-03-14 NOTE — Progress Notes (Signed)
Daily Session Note  Patient Details  Name: Austin Tucker MRN: [id number] Date of Birth: Sep 21, 1956 Referring Provider:   Flowsheet Row CARDIAC REHAB PHASE II ORIENTATION from 12/28/2020 in [place]  Referring Provider Dr. Tamala Julian      Encounter Date: 03/14/2021  Check In:  Session Check In - 03/14/21 0815      Check-In   Supervising physician immediately available to respond to emergencies [facility] MD immediately available    Physician(s) Dr. Melene Muller    Location [facility]    Staff Present Cathren Harsh, MS, Exercise Physiologist;Dalton Kris Mouton, MS, ACSM-CEP, Exercise Physiologist    Virtual Visit No    Medication changes reported     No    Fall or balance concerns reported    No    Tobacco Cessation No Change    Warm-up and Cool-down Performed as group-led instruction    Resistance Training Performed Yes    VAD Patient? No    PAD/SET Patient? No      Pain Assessment   Currently in Pain? No/denies    Pain Score 0-No pain    Multiple Pain Sites No           Capillary Blood Glucose: No results found for this or any previous visit (from the past 24 hour(s)).    Social History   Tobacco Use  Smoking Status Former Smoker  Smokeless Tobacco Never Used    Goals Met:  Independence with exercise equipment Exercise tolerated well No report of cardiac concerns or symptoms Strength training completed today  Goals Unmet:  Not Applicable  Comments: checkout 0915    Dr. Kathie Dike is Medical Director for [facility] Pulmonary Rehab.

## 2021-03-16 ENCOUNTER — Encounter (HOSPITAL_COMMUNITY)
Admission: RE | Admit: 2021-03-16 | Discharge: 2021-03-16 | Disposition: A | Discharge status: Home / Self Care | Payer: Medicare Other | Source: Ambulatory Visit | Attending: Interventional Cardiology | Admitting: Interventional Cardiology

## 2021-03-16 ENCOUNTER — Other Ambulatory Visit: Payer: Self-pay

## 2021-03-16 DIAGNOSIS — I2121 ST elevation (STEMI) myocardial infarction involving left circumflex coronary artery: Secondary | ICD-10-CM

## 2021-03-16 DIAGNOSIS — Z955 Presence of coronary angioplasty implant and graft: Secondary | ICD-10-CM

## 2021-03-16 NOTE — Progress Notes (Signed)
Daily Session Note  Patient Details  Name: Austin Tucker MRN: [id number] Date of Birth: 07/03/1956 Referring Provider:   Flowsheet Row CARDIAC REHAB PHASE II ORIENTATION from 12/28/2020 in [place] CARDIAC RE[facility]ABILITATION  Referring Provider Dr. Smith      Encounter Date: 03/16/2021  Check In:  Session Check In - 03/16/21 0815      Check-In   Supervising physician immediately available to respond to emergencies [facility] MD immediately available    Physician(s) Dr. Ross    Location [facility]    Staff Present Madison Karch, MS, Exercise Physiologist;Dalton Fletcher, MS, ACSM-CEP, Exercise Physiologist    Virtual Visit No    Medication changes reported     No    Fall or balance concerns reported    No    Tobacco Cessation No Change    Warm-up and Cool-down Performed as group-led instruction    Resistance Training Performed Yes    VAD Patient? No    PAD/SET Patient? No      Pain Assessment   Currently in Pain? No/denies    Pain Score 0-No pain    Multiple Pain Sites No           Capillary Blood Glucose: No results found for this or any previous visit (from the past 24 hour(s)).    Social History   Tobacco Use  Smoking Status Former Smoker  Smokeless Tobacco Never Used    Goals Met:  Independence with exercise equipment Exercise tolerated well No report of cardiac concerns or symptoms Strength training completed today  Goals Unmet:  Not Applicable  Comments: check out 0915   Dr. Jehanzeb Memon is Medical Director for [place] [facility]. 

## 2021-03-19 ENCOUNTER — Other Ambulatory Visit: Payer: Self-pay

## 2021-03-19 ENCOUNTER — Encounter (HOSPITAL_COMMUNITY)
Admission: RE | Admit: 2021-03-19 | Discharge: 2021-03-19 | Disposition: A | Discharge status: Home / Self Care | Payer: Medicare Other | Source: Ambulatory Visit | Attending: Interventional Cardiology | Admitting: Interventional Cardiology

## 2021-03-19 VITALS — Ht 62.0 in | Wt 132.9 lb

## 2021-03-19 DIAGNOSIS — I2121 ST elevation (STEMI) myocardial infarction involving left circumflex coronary artery: Secondary | ICD-10-CM

## 2021-03-19 DIAGNOSIS — Z955 Presence of coronary angioplasty implant and graft: Secondary | ICD-10-CM

## 2021-03-19 NOTE — Progress Notes (Signed)
Daily Session Note  Patient Details  Name: Austin Tucker MRN: [id number] Date of Birth: 06-06-1956 Referring Provider:   Flowsheet Row CARDIAC REHAB PHASE II ORIENTATION from 12/28/2020 in [place]  Referring Provider Dr. Tamala Julian      Encounter Date: 03/19/2021  Check In:  Session Check In - 03/19/21 0815      Check-In   Supervising physician immediately available to respond to emergencies [facility] MD immediately available    Physician(s) Dr. Harl Bowie    Location [facility]    Staff Present Cathren Harsh, MS, Exercise Physiologist;[place] Kris Mouton, MS, ACSM-CEP, Exercise Physiologist    Virtual Visit No    Medication changes reported     No    Fall or balance concerns reported    No    Tobacco Cessation No Change    Warm-up and Cool-down Performed as group-led instruction    Resistance Training Performed Yes    VAD Patient? No    PAD/SET Patient? No      Pain Assessment   Currently in Pain? No/denies    Pain Score 0-No pain    Multiple Pain Sites No           Capillary Blood Glucose: No results found for this or any previous visit (from the past 24 hour(s)).    Social History   Tobacco Use  Smoking Status Former Smoker  Smokeless Tobacco Never Used    Goals Met:  Independence with exercise equipment Exercise tolerated well No report of cardiac concerns or symptoms Strength training completed today  Goals Unmet:  Not Applicable  Comments: checkout time is 0915   Dr. Kathie Dike is Medical Director for [facility] Pulmonary Rehab.

## 2021-03-21 ENCOUNTER — Encounter (HOSPITAL_COMMUNITY)
Admission: RE | Admit: 2021-03-21 | Discharge: 2021-03-21 | Disposition: A | Discharge status: Home / Self Care | Payer: Medicare Other | Source: Ambulatory Visit | Attending: Interventional Cardiology | Admitting: Interventional Cardiology

## 2021-03-21 ENCOUNTER — Other Ambulatory Visit: Payer: Self-pay

## 2021-03-21 DIAGNOSIS — I2121 ST elevation (STEMI) myocardial infarction involving left circumflex coronary artery: Secondary | ICD-10-CM

## 2021-03-21 DIAGNOSIS — Z955 Presence of coronary angioplasty implant and graft: Secondary | ICD-10-CM | POA: Diagnosis not present

## 2021-03-21 NOTE — Progress Notes (Signed)
Daily Session Note  Patient Details  Name: Austin Tucker MRN: [id number] Date of Birth: 05/27/1956 Referring Provider:   Flowsheet Row CARDIAC REHAB PHASE II ORIENTATION from 12/28/2020 in [place] CARDIAC REHABILITATION  Referring Provider Dr. Smith      Encounter Date: 03/21/2021  Check In:  Session Check In - 03/21/21 0815      Check-In   Supervising physician immediately available to respond to emergencies [facility] MD immediately available    Physician(s) Dr. Branch    Location [facility]    Staff Present Madison Karch, MS, Exercise Physiologist;Dalton Fletcher, MS, ACSM-CEP, Exercise Physiologist    Virtual Visit No    Medication changes reported     No    Fall or balance concerns reported    No    Tobacco Cessation No Change    Warm-up and Cool-down Performed as group-led instruction    Resistance Training Performed Yes    VAD Patient? No    PAD/SET Patient? No      Pain Assessment   Currently in Pain? No/denies    Pain Score 0-No pain    Multiple Pain Sites No           Capillary Blood Glucose: No results found for this or any previous visit (from the past 24 hour(s)).    Social History   Tobacco Use  Smoking Status Former Smoker  Smokeless Tobacco Never Used    Goals Met:  Independence with exercise equipment Exercise tolerated well No report of cardiac concerns or symptoms Strength training completed today  Goals Unmet:  Not Applicable  Comments: check out 0915   Dr. Jehanzeb Memon is Medical Director for [place] [facility]. 

## 2021-03-22 NOTE — Progress Notes (Signed)
Discharge Progress Report  Patient Details  Name: Austin Tucker MRN: [id number] Date of Birth: 1956/06/08 Referring Provider:   Flowsheet Row CARDIAC REHAB PHASE II ORIENTATION from 12/28/2020 in [facility]  Referring Provider Dr. Tamala Julian       Number of Visits: 36  Reason for Discharge:  Patient reached a stable level of exercise. Patient independent in their exercise. Patient has met program and personal goals.  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 Tamala Julian    Expected Discharge Date 03/16/21      NuStep   Level 1    SPM 60    Minutes 39      Intensity   THRR 40-80% of Max Heartrate 62-125    Ratings of Perceived Exertion 11-15      Resistance Training   Training Prescription Yes    Weight 2    Reps 10-15           Discharge Exercise Prescription (Final Exercise Prescription Changes):  Exercise Prescription Changes - 03/19/21 1000      Response to Exercise   Blood Pressure (Admit) 112/66    Blood Pressure (Exercise) 134/68    Blood Pressure (Exit) 110/66    Heart Rate (Admit) 78 bpm    Heart Rate (Exercise) 91 bpm    Heart Rate (Exit) 87 bpm    Rating of Perceived Exertion (Exercise) 12    Duration Continue with 30 min of aerobic exercise without signs/symptoms of physical distress.    Intensity THRR unchanged      Progression   Progression Continue to progress workloads to maintain intensity without signs/symptoms of physical distress.      Resistance Training   Training Prescription Yes    Weight 4 lbs    Reps 10-15    Time  10 Minutes      NuStep   Level 4    SPM 98    Minutes 22    METs 2.3           Functional Capacity:  6 Minute Walk    Row Name 12/28/20 1018 03/19/21 0836       6 Minute Walk   Phase Initial Discharge    Distance 1400 feet 1650 feet    Walk Time 6 minutes 6 minutes    # of Rest Breaks 0 0    MPH 2.7 3.1    METS 3.18 3.78    RPE 8 7    VO2 Peak 11.15 13.23    Symptoms No No    Resting HR 76 bpm 77 bpm    Resting BP 102/58 112/66    Resting Oxygen Saturation  99 % 100 %    Exercise Oxygen Saturation  during 6 min walk 100 % 98 %    Max Ex. HR 80 bpm 86 bpm    Max Ex. BP 112/62 134/68    2 Minute Post BP 110/60 112/68           Psychological, QOL, Others - Outcomes: PHQ 2/9: Depression screen [facility] 2/9 03/22/2021 12/28/2020  Decreased Interest 0  0  Down, Depressed, Hopeless 1 0  PHQ - 2 Score 1 0  Altered sleeping 1 1  Tired, decreased energy 1 0  Change in appetite 1 0  Feeling bad or failure about yourself  1 0  Trouble concentrating 0 0  Moving slowly or fidgety/restless 0 0  Suicidal thoughts 0 0  PHQ-9 Score 5 1  Difficult doing work/chores Not difficult at all Not difficult at all    Quality of Life:  Quality of Life - 03/22/21 0844      Quality of Life Scores   Health/Function Pre 18.8 %    Health/Function Post 19.87 %    Health/Function % Change 5.69 %    Socioeconomic Pre 22.58 %    Socioeconomic Post 17.5 %    Socioeconomic % Change  -22.5 %    Psych/Spiritual Pre 21.79 %    Psych/Spiritual Post 19.83 %    Psych/Spiritual % Change -8.99 %    Family Pre 19.8 %    Family Post 19.5 %    Family % Change -1.52 %    GLOBAL Pre 20.27 %    GLOBAL Post 19.42 %    GLOBAL % Change -4.19 %           Personal Goals: Goals established at orientation with interventions provided to work toward goal.  Personal Goals and Risk Factors at Admission - 12/28/20 1045      Core Components/Risk Factors/Patient Goals on Admission    Weight Management  Weight Maintenance    Diabetes Yes    Intervention Provide education about signs/symptoms and action to take for hypo/hyperglycemia.;Provide education about proper nutrition, including hydration, and aerobic/resistive exercise prescription along with prescribed medications to achieve blood glucose in normal ranges: Fasting glucose 65-99 mg/dL    Expected Outcomes Short Term: Participant verbalizes understanding of the signs/symptoms and immediate care of hyper/hypoglycemia, proper foot care and importance of medication, aerobic/resistive exercise and nutrition plan for blood glucose control.;Long Term: Attainment of HbA1C < 7%.    Personal Goal Other Yes    Personal Goal Learn about eating healthier; Get stronger; Get back to doing his ADL's; get healthier overall.    Intervention Provide education and monitored exercise for 12 weeks.    Expected Outcomes Patient will meet both program and personal goals.            Personal Goals Discharge:  Goals and Risk Factor Review    Row Name 01/01/21 1405 01/29/21 1007 02/26/21 0954 03/22/21 0835       Core Components/Risk Factors/Patient Goals Review   Personal Goals Review Weight Management/Obesity;Other;Diabetes -- Weight Management/Obesity;Other;Diabetes Weight Management/Obesity;Other;Diabetes    Review Patient is new to the program completing 2 sessions. He was referred to cardiac rebab with [facility]. He has multiple risk factors for CAD and is doing the program for risk modification. His last A1C on file was 8.0% on 11/30/20. He is a newly diagnosed diabetic using Metformin and diet for control. His personal goals for the program are to get stronger; be able to return to his ADL's and to learn more about healthy eating and get health overall. We will continue to monitor for progress as he works toward meeting these goals. Patient has completed 14 sessions losing 2 lbs since last 30 day review. He is doing well in the program with progression and  consistent attendance. He saw a dermotalogist 2/8 due to chronic psoriasis. MD discontinues methotrexate due to a h/o CHF and added Quantiferon and ilumya 100  gm SQ to manage the psoriasis. Patient's b/p is well controlled. His reported glucose readings are WNL.He is a newly diagnosed Diabetic. His A1C was 8% 11/30/20. He is using Meformin and diet for control. His personal goals for the program are to get stronger; return to his ADL's and to learn about healthy eating and get healthier overall. Patient has completed 25 sessions maintaining his weight since his initial visit. He continues to do well in the program with consistent attandance and progression. His most recent A1C on file was 8.0% 11/21/20. His personal goals for the program are to get stronger; return to his ADL's; get heathier overall and learn about heatlhy eating. We will continue to monitor as he works toward meeting these goals. Patient has completed 36 sessions. He has gained about 3 lbs since his initial visit. He had progressed well in the program. His personal goals were to get stronger, return to ADLs, and get healthier overall. He got stronger throughout the program and increased his stamina. He reports that ADLs are easier since he entered the program. His blood sugars have been in control. Overall throughout his time in the program, he met most of his goals and is going to keep workin on his goals outside of the program.    Expected Outcomes Patient will complete the program meeting both personal and program goal.s Patient will complete the program meeting both personal and program goal.s Patient will complete the program meeting both personal and program goal.s --           Exercise Goals and Review:  Exercise Goals    Row Name 12/28/20 1020 01/08/21 1038 02/05/21 1030 03/05/21 1030       Exercise Goals   Increase Physical Activity Yes Yes Yes Yes    Intervention Provide advice, education, support and counseling about  physical activity/exercise needs.;Develop an individualized exercise prescription for aerobic and resistive training based on initial evaluation findings, risk stratification, comorbidities and participant's personal goals. Provide advice, education, support and counseling about physical activity/exercise needs.;Develop an individualized exercise prescription for aerobic and resistive training based on initial evaluation findings, risk stratification, comorbidities and participant's personal goals. Provide advice, education, support and counseling about physical activity/exercise needs.;Develop an individualized exercise prescription for aerobic and resistive training based on initial evaluation findings, risk stratification, comorbidities and participant's personal goals. Provide advice, education, support and counseling about physical activity/exercise needs.;Develop an individualized exercise prescription for aerobic and resistive training based on initial evaluation findings, risk stratification, comorbidities and participant's personal goals.    Expected Outcomes Short Term: Attend rehab on a regular basis to increase amount of physical activity.;Long Term: Add in home exercise to make exercise part of routine and to increase amount of physical activity.;Long Term: Exercising regularly at least 3-5 days a week. Short Term: Attend rehab on a regular basis to increase amount of physical activity.;Long Term: Add in home exercise to make exercise part of routine and to increase amount of physical activity.;Long Term: Exercising regularly at least 3-5 days a week. Short Term: Attend rehab on a regular basis to increase amount of physical activity.;Long Term: Add in home exercise to make exercise part of routine and to increase amount of physical activity.;Long Term: Exercising regularly at least 3-5 days a week. Short Term: Attend rehab on a regular basis to increase amount of physical activity.;Long Term: Add in  home exercise to make exercise part of routine and to increase amount of physical activity.;Long Term: Exercising regularly at least 3-5 days  a week.    Increase Strength and Stamina Yes Yes Yes Yes    Intervention Develop an individualized exercise prescription for aerobic and resistive training based on initial evaluation findings, risk stratification, comorbidities and participant's personal goals.;Provide advice, education, support and counseling about physical activity/exercise needs. Develop an individualized exercise prescription for aerobic and resistive training based on initial evaluation findings, risk stratification, comorbidities and participant's personal goals.;Provide advice, education, support and counseling about physical activity/exercise needs. Develop an individualized exercise prescription for aerobic and resistive training based on initial evaluation findings, risk stratification, comorbidities and participant's personal goals.;Provide advice, education, support and counseling about physical activity/exercise needs. Develop an individualized exercise prescription for aerobic and resistive training based on initial evaluation findings, risk stratification, comorbidities and participant's personal goals.;Provide advice, education, support and counseling about physical activity/exercise needs.    Expected Outcomes Short Term: Increase workloads from initial exercise prescription for resistance, speed, and METs.;Short Term: Perform resistance training exercises routinely during rehab and add in resistance training at home;Long Term: Improve cardiorespiratory fitness, muscular endurance and strength as measured by increased METs and functional capacity (6MWT) Short Term: Increase workloads from initial exercise prescription for resistance, speed, and METs.;Short Term: Perform resistance training exercises routinely during rehab and add in resistance training at home;Long Term: Improve  cardiorespiratory fitness, muscular endurance and strength as measured by increased METs and functional capacity (6MWT) Short Term: Increase workloads from initial exercise prescription for resistance, speed, and METs.;Short Term: Perform resistance training exercises routinely during rehab and add in resistance training at home;Long Term: Improve cardiorespiratory fitness, muscular endurance and strength as measured by increased METs and functional capacity (6MWT) Short Term: Increase workloads from initial exercise prescription for resistance, speed, and METs.;Short Term: Perform resistance training exercises routinely during rehab and add in resistance training at home;Long Term: Improve cardiorespiratory fitness, muscular endurance and strength as measured by increased METs and functional capacity (6MWT)    Able to understand and use rate of perceived exertion (RPE) scale Yes Yes Yes Yes    Intervention Provide education and explanation on how to use RPE scale Provide education and explanation on how to use RPE scale Provide education and explanation on how to use RPE scale Provide education and explanation on how to use RPE scale    Expected Outcomes Short Term: Able to use RPE daily in rehab to express subjective intensity level;Long Term:  Able to use RPE to guide intensity level when exercising independently Short Term: Able to use RPE daily in rehab to express subjective intensity level;Long Term:  Able to use RPE to guide intensity level when exercising independently Short Term: Able to use RPE daily in rehab to express subjective intensity level;Long Term:  Able to use RPE to guide intensity level when exercising independently Short Term: Able to use RPE daily in rehab to express subjective intensity level;Long Term:  Able to use RPE to guide intensity level when exercising independently    Knowledge and understanding of Target Heart Rate Range (THRR) Yes Yes Yes Yes    Intervention Provide education  and explanation of THRR including how the numbers were predicted and where they are located for reference Provide education and explanation of THRR including how the numbers were predicted and where they are located for reference Provide education and explanation of THRR including how the numbers were predicted and where they are located for reference Provide education and explanation of THRR including how the numbers were predicted and where they are located for reference  Expected Outcomes Short Term: Able to state/look up THRR;Long Term: Able to use THRR to govern intensity when exercising independently;Short Term: Able to use daily as guideline for intensity in rehab Short Term: Able to state/look up THRR;Long Term: Able to use THRR to govern intensity when exercising independently;Short Term: Able to use daily as guideline for intensity in rehab Short Term: Able to state/look up THRR;Long Term: Able to use THRR to govern intensity when exercising independently;Short Term: Able to use daily as guideline for intensity in rehab Short Term: Able to state/look up THRR;Long Term: Able to use THRR to govern intensity when exercising independently;Short Term: Able to use daily as guideline for intensity in rehab    Able to check pulse independently Yes Yes Yes Yes    Intervention Provide education and demonstration on how to check pulse in carotid and radial arteries.;Review the importance of being able to check your own pulse for safety during independent exercise -- Provide education and demonstration on how to check pulse in carotid and radial arteries.;Review the importance of being able to check your own pulse for safety during independent exercise Provide education and demonstration on how to check pulse in carotid and radial arteries.;Review the importance of being able to check your own pulse for safety during independent exercise    Expected Outcomes Short Term: Able to explain why pulse checking is  important during independent exercise;Long Term: Able to check pulse independently and accurately Short Term: Able to explain why pulse checking is important during independent exercise;Long Term: Able to check pulse independently and accurately Short Term: Able to explain why pulse checking is important during independent exercise;Long Term: Able to check pulse independently and accurately Short Term: Able to explain why pulse checking is important during independent exercise;Long Term: Able to check pulse independently and accurately    Understanding of Exercise Prescription Yes Yes Yes Yes    Intervention Provide education, explanation, and written materials on patient's individual exercise prescription Provide education, explanation, and written materials on patient's individual exercise prescription Provide education, explanation, and written materials on patient's individual exercise prescription Provide education, explanation, and written materials on patient's individual exercise prescription    Expected Outcomes Short Term: Able to explain program exercise prescription;Long Term: Able to explain home exercise prescription to exercise independently Short Term: Able to explain program exercise prescription;Long Term: Able to explain home exercise prescription to exercise independently Short Term: Able to explain program exercise prescription;Long Term: Able to explain home exercise prescription to exercise independently Short Term: Able to explain program exercise prescription;Long Term: Able to explain home exercise prescription to exercise independently           Exercise Goals Re-Evaluation:  Exercise Goals Re-Evaluation    Row Name 01/08/21 1039 02/05/21 1031 03/05/21 1030 03/22/21 0840       Exercise Goal Re-Evaluation   Exercise Goals Review Increase Physical Activity;Increase Strength and Stamina;Able to understand and use rate of perceived exertion (RPE) scale;Knowledge and  understanding of Target Heart Rate Range (THRR);Able to check pulse independently;Understanding of Exercise Prescription Increase Physical Activity;Increase Strength and Stamina;Able to understand and use rate of perceived exertion (RPE) scale;Knowledge and understanding of Target Heart Rate Range (THRR);Able to check pulse independently;Understanding of Exercise Prescription Increase Physical Activity;Increase Strength and Stamina;Able to understand and use rate of perceived exertion (RPE) scale;Knowledge and understanding of Target Heart Rate Range (THRR);Able to check pulse independently;Understanding of Exercise Prescription Increase Physical Activity;Increase Strength and Stamina;Able to understand and use rate of perceived exertion (  RPE) scale;Knowledge and understanding of Target Heart Rate Range (THRR);Able to check pulse independently;Understanding of Exercise Prescription    Comments Patient has completed 4 exercise sessions. He is tolerating exercise well and increasing his spm on the NuStep quickly. He is catching on to the program routine very well and continues to improve. He has a very positive attitute about rehab and enjoys coming. He is currently exercising at 2.0 METs on the NuStep. Will continue to progress as able. patient has completed 16 exercise sessions. He is tolerating exercise well and increasing his intensities. He is improving by increasing his strength as well as his endurance. He says that he is getting stronger and he can tell that the program is helping him in his daily life. He is exercising at home and continues to do well. He is currently exercising at 2.2 METs on the NuStep. Will continue to monitor and progress as able. Patient has completed 28 exercise sessions. He has progressed well with exercise. He has increased his intensities on the NuStep to level 4 and is maintaining a good pace on that. He has increased his resistance training weights to 4lbs. He feels he is getting  stronger and is improving his ADLs. He is exercising, walking, at home 3 days per week outside of rehab for 30 min per day. He is currently exercising at 2.5 METs on the Nustep. Will continue to monitor and progress as able. Patient had completed 36 sessions before discharge. He progressed well with exercise. He was able to increase his intensities well and was always eager to keep working hard. He has gotten stronger in the program. He is exercising at home and will continue to exercise at home after the program. He is debating about getting a [employer] membership to start swimming and taking fitness classes. He is motivated to keep exercising at home. He ended the program exercising at 6.5 METs on the Elliptical.    Expected Outcomes Through exercise at rehab and a home exercise program , patient will achieve their goals. Through exercise at rehab and a home exercise program , patient will achieve their goals. Through exercise at rehab and a home exercise program , patient will achieve their goals. --           Nutrition & Weight - Outcomes:  Pre Biometrics - 03/19/21 0837      Pre Biometrics   Height '5\' 2"'  (1.575 m)    Weight 60.3 kg    Waist Circumference 35 inches    Hip Circumference 35.5 inches    Waist to Hip Ratio 0.99 %    BMI (Calculated) 24.31    Triceps Skinfold 10 mm    % Body Fat 22.8 %    Grip Strength 17.7 kg    Flexibility 11 in    Single Leg Stand 19 seconds            Nutrition:  Nutrition Therapy & Goals - 01/29/21 0942      Personal Nutrition Goals   Comments We will continue to provide heart health nutritional education through handouts.      Intervention Plan   Intervention Nutrition handout(s) given to patient.           Nutrition Discharge:  Nutrition Assessments - 03/22/21 0824      MEDFICTS Scores   Pre Score 33    Post Score 45    Score Difference 12           Education Questionnaire Score:  Knowledge  Questionnaire Score - 03/22/21 4458       Knowledge Questionnaire Score   Pre Score 24/28    Post Score 24/28          Patient graduated from [facility] today on 03-21-21 after completing 36 sessions. They achieved LTG of 30 minutes of aerobic exercise at Max Met level of 6.5. All patients vitals are WNL. Discharge instruction has been reviewed in detail and patient stated an understanding of material given. Patient plans to exercise at home. Cardiac Rehab staff will make f/u call. Patient had no complaints of any abnormal S/S or pain on their exit visit.     Goals reviewed with patient; copy given to patient.

## 2021-03-23 ENCOUNTER — Encounter (HOSPITAL_COMMUNITY): Payer: Medicare Other

## 2021-03-30 DIAGNOSIS — E1165 Type 2 diabetes mellitus with hyperglycemia: Secondary | ICD-10-CM | POA: Diagnosis not present

## 2021-04-04 DIAGNOSIS — Z955 Presence of coronary angioplasty implant and graft: Secondary | ICD-10-CM | POA: Diagnosis not present

## 2021-04-04 DIAGNOSIS — E7800 Pure hypercholesterolemia, unspecified: Secondary | ICD-10-CM | POA: Diagnosis not present

## 2021-04-04 DIAGNOSIS — E1100 Type 2 diabetes mellitus with hyperosmolarity without nonketotic hyperglycemic-hyperosmolar coma (NKHHC): Secondary | ICD-10-CM | POA: Diagnosis not present

## 2021-04-23 ENCOUNTER — Other Ambulatory Visit: Payer: Self-pay | Admitting: Medical

## 2021-05-01 DIAGNOSIS — E1165 Type 2 diabetes mellitus with hyperglycemia: Secondary | ICD-10-CM | POA: Diagnosis not present

## 2021-05-23 DIAGNOSIS — Z6824 Body mass index (BMI) 24.0-24.9, adult: Secondary | ICD-10-CM | POA: Diagnosis not present

## 2021-05-23 DIAGNOSIS — E1165 Type 2 diabetes mellitus with hyperglycemia: Secondary | ICD-10-CM | POA: Diagnosis not present

## 2021-05-23 DIAGNOSIS — Z299 Encounter for prophylactic measures, unspecified: Secondary | ICD-10-CM | POA: Diagnosis not present

## 2021-05-23 DIAGNOSIS — I10 Essential (primary) hypertension: Secondary | ICD-10-CM | POA: Diagnosis not present

## 2021-05-23 DIAGNOSIS — L409 Psoriasis, unspecified: Secondary | ICD-10-CM | POA: Diagnosis not present

## 2021-05-29 ENCOUNTER — Other Ambulatory Visit: Payer: Self-pay | Admitting: Medical

## 2021-05-31 DIAGNOSIS — N200 Calculus of kidney: Secondary | ICD-10-CM | POA: Diagnosis not present

## 2021-05-31 DIAGNOSIS — E1165 Type 2 diabetes mellitus with hyperglycemia: Secondary | ICD-10-CM | POA: Diagnosis not present

## 2021-06-29 DIAGNOSIS — E1165 Type 2 diabetes mellitus with hyperglycemia: Secondary | ICD-10-CM | POA: Diagnosis not present

## 2021-07-01 DIAGNOSIS — E1165 Type 2 diabetes mellitus with hyperglycemia: Secondary | ICD-10-CM | POA: Diagnosis not present

## 2021-07-20 DIAGNOSIS — I2119 ST elevation (STEMI) myocardial infarction involving other coronary artery of inferior wall: Secondary | ICD-10-CM | POA: Diagnosis not present

## 2021-07-20 DIAGNOSIS — Z87891 Personal history of nicotine dependence: Secondary | ICD-10-CM | POA: Diagnosis not present

## 2021-07-20 DIAGNOSIS — Z955 Presence of coronary angioplasty implant and graft: Secondary | ICD-10-CM | POA: Diagnosis not present

## 2021-07-20 DIAGNOSIS — E1100 Type 2 diabetes mellitus with hyperosmolarity without nonketotic hyperglycemic-hyperosmolar coma (NKHHC): Secondary | ICD-10-CM | POA: Diagnosis not present

## 2021-07-20 DIAGNOSIS — E7800 Pure hypercholesterolemia, unspecified: Secondary | ICD-10-CM | POA: Diagnosis not present

## 2021-08-01 DIAGNOSIS — E1165 Type 2 diabetes mellitus with hyperglycemia: Secondary | ICD-10-CM | POA: Diagnosis not present

## 2021-08-08 DIAGNOSIS — E1165 Type 2 diabetes mellitus with hyperglycemia: Secondary | ICD-10-CM | POA: Diagnosis not present

## 2021-08-08 DIAGNOSIS — Z299 Encounter for prophylactic measures, unspecified: Secondary | ICD-10-CM | POA: Diagnosis not present

## 2021-08-31 DIAGNOSIS — E1165 Type 2 diabetes mellitus with hyperglycemia: Secondary | ICD-10-CM | POA: Diagnosis not present

## 2021-09-20 DIAGNOSIS — I10 Essential (primary) hypertension: Secondary | ICD-10-CM | POA: Diagnosis not present

## 2021-09-20 DIAGNOSIS — E7800 Pure hypercholesterolemia, unspecified: Secondary | ICD-10-CM | POA: Diagnosis not present

## 2021-09-20 DIAGNOSIS — Z6823 Body mass index (BMI) 23.0-23.9, adult: Secondary | ICD-10-CM | POA: Diagnosis not present

## 2021-09-20 DIAGNOSIS — Z0000 Encounter for general adult medical examination without abnormal findings: Secondary | ICD-10-CM | POA: Diagnosis not present

## 2021-09-20 DIAGNOSIS — Z23 Encounter for immunization: Secondary | ICD-10-CM | POA: Diagnosis not present

## 2021-09-20 DIAGNOSIS — Z7189 Other specified counseling: Secondary | ICD-10-CM | POA: Diagnosis not present

## 2021-09-20 DIAGNOSIS — E1165 Type 2 diabetes mellitus with hyperglycemia: Secondary | ICD-10-CM | POA: Diagnosis not present

## 2021-09-20 DIAGNOSIS — Z299 Encounter for prophylactic measures, unspecified: Secondary | ICD-10-CM | POA: Diagnosis not present

## 2021-10-01 DIAGNOSIS — E1165 Type 2 diabetes mellitus with hyperglycemia: Secondary | ICD-10-CM | POA: Diagnosis not present

## 2021-10-11 DIAGNOSIS — L409 Psoriasis, unspecified: Secondary | ICD-10-CM | POA: Diagnosis not present

## 2021-10-11 DIAGNOSIS — Z6823 Body mass index (BMI) 23.0-23.9, adult: Secondary | ICD-10-CM | POA: Diagnosis not present

## 2021-10-11 DIAGNOSIS — E1165 Type 2 diabetes mellitus with hyperglycemia: Secondary | ICD-10-CM | POA: Diagnosis not present

## 2021-10-11 DIAGNOSIS — Z299 Encounter for prophylactic measures, unspecified: Secondary | ICD-10-CM | POA: Diagnosis not present

## 2021-10-11 DIAGNOSIS — I10 Essential (primary) hypertension: Secondary | ICD-10-CM | POA: Diagnosis not present

## 2021-10-31 DIAGNOSIS — E1165 Type 2 diabetes mellitus with hyperglycemia: Secondary | ICD-10-CM | POA: Diagnosis not present

## 2021-11-09 DIAGNOSIS — Z23 Encounter for immunization: Secondary | ICD-10-CM | POA: Diagnosis not present

## 2021-11-20 DIAGNOSIS — I2119 ST elevation (STEMI) myocardial infarction involving other coronary artery of inferior wall: Secondary | ICD-10-CM | POA: Diagnosis not present

## 2021-11-20 DIAGNOSIS — Z87891 Personal history of nicotine dependence: Secondary | ICD-10-CM | POA: Diagnosis not present

## 2021-11-20 DIAGNOSIS — E1100 Type 2 diabetes mellitus with hyperosmolarity without nonketotic hyperglycemic-hyperosmolar coma (NKHHC): Secondary | ICD-10-CM | POA: Diagnosis not present

## 2021-11-20 DIAGNOSIS — I2121 ST elevation (STEMI) myocardial infarction involving left circumflex coronary artery: Secondary | ICD-10-CM | POA: Diagnosis not present

## 2021-11-20 DIAGNOSIS — E7800 Pure hypercholesterolemia, unspecified: Secondary | ICD-10-CM | POA: Diagnosis not present

## 2021-11-20 DIAGNOSIS — Z955 Presence of coronary angioplasty implant and graft: Secondary | ICD-10-CM | POA: Diagnosis not present

## 2021-11-24 ENCOUNTER — Other Ambulatory Visit: Payer: Self-pay | Admitting: Medical

## 2021-11-30 DIAGNOSIS — E1165 Type 2 diabetes mellitus with hyperglycemia: Secondary | ICD-10-CM | POA: Diagnosis not present

## 2021-12-30 DIAGNOSIS — E1165 Type 2 diabetes mellitus with hyperglycemia: Secondary | ICD-10-CM | POA: Diagnosis not present

## 2022-01-29 DIAGNOSIS — E1165 Type 2 diabetes mellitus with hyperglycemia: Secondary | ICD-10-CM | POA: Diagnosis not present

## 2022-01-30 DIAGNOSIS — Z87891 Personal history of nicotine dependence: Secondary | ICD-10-CM | POA: Diagnosis not present

## 2022-01-30 DIAGNOSIS — Z6823 Body mass index (BMI) 23.0-23.9, adult: Secondary | ICD-10-CM | POA: Diagnosis not present

## 2022-01-30 DIAGNOSIS — Z299 Encounter for prophylactic measures, unspecified: Secondary | ICD-10-CM | POA: Diagnosis not present

## 2022-01-30 DIAGNOSIS — E1165 Type 2 diabetes mellitus with hyperglycemia: Secondary | ICD-10-CM | POA: Diagnosis not present

## 2022-01-30 DIAGNOSIS — I10 Essential (primary) hypertension: Secondary | ICD-10-CM | POA: Diagnosis not present

## 2022-01-30 DIAGNOSIS — D849 Immunodeficiency, unspecified: Secondary | ICD-10-CM | POA: Diagnosis not present

## 2022-02-12 DIAGNOSIS — E119 Type 2 diabetes mellitus without complications: Secondary | ICD-10-CM | POA: Diagnosis not present

## 2022-02-28 DIAGNOSIS — E1165 Type 2 diabetes mellitus with hyperglycemia: Secondary | ICD-10-CM | POA: Diagnosis not present

## 2022-03-07 DIAGNOSIS — Z299 Encounter for prophylactic measures, unspecified: Secondary | ICD-10-CM | POA: Diagnosis not present

## 2022-03-07 DIAGNOSIS — R5383 Other fatigue: Secondary | ICD-10-CM | POA: Diagnosis not present

## 2022-03-07 DIAGNOSIS — Z713 Dietary counseling and surveillance: Secondary | ICD-10-CM | POA: Diagnosis not present

## 2022-03-07 DIAGNOSIS — E1165 Type 2 diabetes mellitus with hyperglycemia: Secondary | ICD-10-CM | POA: Diagnosis not present

## 2022-03-22 DIAGNOSIS — E1100 Type 2 diabetes mellitus with hyperosmolarity without nonketotic hyperglycemic-hyperosmolar coma (NKHHC): Secondary | ICD-10-CM | POA: Diagnosis not present

## 2022-03-22 DIAGNOSIS — I2119 ST elevation (STEMI) myocardial infarction involving other coronary artery of inferior wall: Secondary | ICD-10-CM | POA: Diagnosis not present

## 2022-03-22 DIAGNOSIS — I2121 ST elevation (STEMI) myocardial infarction involving left circumflex coronary artery: Secondary | ICD-10-CM | POA: Diagnosis not present

## 2022-03-22 DIAGNOSIS — E7800 Pure hypercholesterolemia, unspecified: Secondary | ICD-10-CM | POA: Diagnosis not present

## 2022-03-22 DIAGNOSIS — Z955 Presence of coronary angioplasty implant and graft: Secondary | ICD-10-CM | POA: Diagnosis not present

## 2022-03-22 DIAGNOSIS — Z87891 Personal history of nicotine dependence: Secondary | ICD-10-CM | POA: Diagnosis not present

## 2022-03-29 DIAGNOSIS — E1165 Type 2 diabetes mellitus with hyperglycemia: Secondary | ICD-10-CM | POA: Diagnosis not present

## 2022-03-29 DIAGNOSIS — Z299 Encounter for prophylactic measures, unspecified: Secondary | ICD-10-CM | POA: Diagnosis not present

## 2022-03-29 DIAGNOSIS — Z713 Dietary counseling and surveillance: Secondary | ICD-10-CM | POA: Diagnosis not present

## 2022-03-29 DIAGNOSIS — Z6824 Body mass index (BMI) 24.0-24.9, adult: Secondary | ICD-10-CM | POA: Diagnosis not present

## 2022-03-29 DIAGNOSIS — E7800 Pure hypercholesterolemia, unspecified: Secondary | ICD-10-CM | POA: Diagnosis not present

## 2022-03-31 DIAGNOSIS — E1165 Type 2 diabetes mellitus with hyperglycemia: Secondary | ICD-10-CM | POA: Diagnosis not present

## 2022-04-30 DIAGNOSIS — E1165 Type 2 diabetes mellitus with hyperglycemia: Secondary | ICD-10-CM | POA: Diagnosis not present

## 2022-05-06 DIAGNOSIS — E1165 Type 2 diabetes mellitus with hyperglycemia: Secondary | ICD-10-CM | POA: Diagnosis not present

## 2022-05-06 DIAGNOSIS — Z299 Encounter for prophylactic measures, unspecified: Secondary | ICD-10-CM | POA: Diagnosis not present

## 2022-05-06 DIAGNOSIS — E7800 Pure hypercholesterolemia, unspecified: Secondary | ICD-10-CM | POA: Diagnosis not present

## 2022-05-06 DIAGNOSIS — I10 Essential (primary) hypertension: Secondary | ICD-10-CM | POA: Diagnosis not present

## 2022-05-30 DIAGNOSIS — E1165 Type 2 diabetes mellitus with hyperglycemia: Secondary | ICD-10-CM | POA: Diagnosis not present

## 2022-07-01 DIAGNOSIS — E1165 Type 2 diabetes mellitus with hyperglycemia: Secondary | ICD-10-CM | POA: Diagnosis not present

## 2022-07-22 DIAGNOSIS — I2119 ST elevation (STEMI) myocardial infarction involving other coronary artery of inferior wall: Secondary | ICD-10-CM | POA: Diagnosis not present

## 2022-07-22 DIAGNOSIS — I2121 ST elevation (STEMI) myocardial infarction involving left circumflex coronary artery: Secondary | ICD-10-CM | POA: Diagnosis not present

## 2022-07-22 DIAGNOSIS — I2510 Atherosclerotic heart disease of native coronary artery without angina pectoris: Secondary | ICD-10-CM | POA: Diagnosis not present

## 2022-07-22 DIAGNOSIS — E7800 Pure hypercholesterolemia, unspecified: Secondary | ICD-10-CM | POA: Diagnosis not present

## 2022-07-22 DIAGNOSIS — Z955 Presence of coronary angioplasty implant and graft: Secondary | ICD-10-CM | POA: Diagnosis not present

## 2022-07-22 DIAGNOSIS — E1100 Type 2 diabetes mellitus with hyperosmolarity without nonketotic hyperglycemic-hyperosmolar coma (NKHHC): Secondary | ICD-10-CM | POA: Diagnosis not present

## 2022-07-31 DIAGNOSIS — E1165 Type 2 diabetes mellitus with hyperglycemia: Secondary | ICD-10-CM | POA: Diagnosis not present

## 2022-08-13 DIAGNOSIS — I10 Essential (primary) hypertension: Secondary | ICD-10-CM | POA: Diagnosis not present

## 2022-08-13 DIAGNOSIS — Z299 Encounter for prophylactic measures, unspecified: Secondary | ICD-10-CM | POA: Diagnosis not present

## 2022-08-13 DIAGNOSIS — E1165 Type 2 diabetes mellitus with hyperglycemia: Secondary | ICD-10-CM | POA: Diagnosis not present

## 2022-08-30 DIAGNOSIS — E1165 Type 2 diabetes mellitus with hyperglycemia: Secondary | ICD-10-CM | POA: Diagnosis not present

## 2022-09-30 DIAGNOSIS — E1165 Type 2 diabetes mellitus with hyperglycemia: Secondary | ICD-10-CM | POA: Diagnosis not present

## 2022-10-04 DIAGNOSIS — Z79899 Other long term (current) drug therapy: Secondary | ICD-10-CM | POA: Diagnosis not present

## 2022-10-04 DIAGNOSIS — E7800 Pure hypercholesterolemia, unspecified: Secondary | ICD-10-CM | POA: Diagnosis not present

## 2022-10-04 DIAGNOSIS — Z0000 Encounter for general adult medical examination without abnormal findings: Secondary | ICD-10-CM | POA: Diagnosis not present

## 2022-10-04 DIAGNOSIS — R5383 Other fatigue: Secondary | ICD-10-CM | POA: Diagnosis not present

## 2022-10-04 DIAGNOSIS — Z299 Encounter for prophylactic measures, unspecified: Secondary | ICD-10-CM | POA: Diagnosis not present

## 2022-10-04 DIAGNOSIS — Z6823 Body mass index (BMI) 23.0-23.9, adult: Secondary | ICD-10-CM | POA: Diagnosis not present

## 2022-10-04 DIAGNOSIS — I10 Essential (primary) hypertension: Secondary | ICD-10-CM | POA: Diagnosis not present

## 2022-10-07 DIAGNOSIS — Z79899 Other long term (current) drug therapy: Secondary | ICD-10-CM | POA: Diagnosis not present

## 2022-10-07 DIAGNOSIS — E7800 Pure hypercholesterolemia, unspecified: Secondary | ICD-10-CM | POA: Diagnosis not present

## 2022-10-07 DIAGNOSIS — Z23 Encounter for immunization: Secondary | ICD-10-CM | POA: Diagnosis not present

## 2022-10-07 DIAGNOSIS — R5383 Other fatigue: Secondary | ICD-10-CM | POA: Diagnosis not present

## 2022-10-30 DIAGNOSIS — E1165 Type 2 diabetes mellitus with hyperglycemia: Secondary | ICD-10-CM | POA: Diagnosis not present

## 2022-10-31 DIAGNOSIS — I10 Essential (primary) hypertension: Secondary | ICD-10-CM | POA: Diagnosis not present

## 2022-10-31 DIAGNOSIS — I2510 Atherosclerotic heart disease of native coronary artery without angina pectoris: Secondary | ICD-10-CM | POA: Diagnosis not present

## 2022-11-29 DIAGNOSIS — E1165 Type 2 diabetes mellitus with hyperglycemia: Secondary | ICD-10-CM | POA: Diagnosis not present

## 2022-12-24 DIAGNOSIS — F17210 Nicotine dependence, cigarettes, uncomplicated: Secondary | ICD-10-CM | POA: Diagnosis not present

## 2022-12-24 DIAGNOSIS — E1165 Type 2 diabetes mellitus with hyperglycemia: Secondary | ICD-10-CM | POA: Diagnosis not present

## 2022-12-24 DIAGNOSIS — E7800 Pure hypercholesterolemia, unspecified: Secondary | ICD-10-CM | POA: Diagnosis not present

## 2022-12-24 DIAGNOSIS — Z299 Encounter for prophylactic measures, unspecified: Secondary | ICD-10-CM | POA: Diagnosis not present

## 2022-12-25 DIAGNOSIS — L400 Psoriasis vulgaris: Secondary | ICD-10-CM | POA: Diagnosis not present

## 2022-12-27 DIAGNOSIS — E1165 Type 2 diabetes mellitus with hyperglycemia: Secondary | ICD-10-CM | POA: Diagnosis not present

## 2022-12-29 ENCOUNTER — Encounter (HOSPITAL_COMMUNITY): Payer: Self-pay

## 2022-12-29 ENCOUNTER — Emergency Department (HOSPITAL_COMMUNITY)
Admission: EM | Admit: 2022-12-29 | Discharge: 2022-12-30 | Disposition: A | Discharge status: Home / Self Care | Payer: 59 | Attending: Emergency Medicine | Admitting: Emergency Medicine

## 2022-12-29 ENCOUNTER — Other Ambulatory Visit: Payer: Self-pay

## 2022-12-29 ENCOUNTER — Emergency Department (HOSPITAL_COMMUNITY): Payer: 59

## 2022-12-29 DIAGNOSIS — Z7982 Long term (current) use of aspirin: Secondary | ICD-10-CM | POA: Diagnosis not present

## 2022-12-29 DIAGNOSIS — Z7984 Long term (current) use of oral hypoglycemic drugs: Secondary | ICD-10-CM | POA: Insufficient documentation

## 2022-12-29 DIAGNOSIS — E119 Type 2 diabetes mellitus without complications: Secondary | ICD-10-CM | POA: Diagnosis not present

## 2022-12-29 DIAGNOSIS — R1030 Lower abdominal pain, unspecified: Secondary | ICD-10-CM

## 2022-12-29 DIAGNOSIS — N200 Calculus of kidney: Secondary | ICD-10-CM | POA: Diagnosis not present

## 2022-12-29 DIAGNOSIS — N281 Cyst of kidney, acquired: Secondary | ICD-10-CM | POA: Diagnosis not present

## 2022-12-29 HISTORY — DX: Type 2 diabetes mellitus without complications: E11.9

## 2022-12-29 LAB — COMPREHENSIVE METABOLIC PANEL
ALT: 18 U/L (ref 0–44)
AST: 22 U/L (ref 15–41)
Albumin: 4.2 g/dL (ref 3.5–5.0)
Alkaline Phosphatase: 91 U/L (ref 38–126)
Anion gap: 13 (ref 5–15)
BUN: 22 mg/dL (ref 8–23)
CO2: 20 mmol/L — ABNORMAL LOW (ref 22–32)
Calcium: 9 mg/dL (ref 8.9–10.3)
Chloride: 97 mmol/L — ABNORMAL LOW (ref 98–111)
Creatinine, Ser: 1.21 mg/dL (ref 0.61–1.24)
GFR, Estimated: >60 mL/min (ref >60)
Glucose, Bld: 161 mg/dL — ABNORMAL HIGH (ref 70–99)
Potassium: 4.7 mmol/L (ref 3.5–5.1)
Sodium: 130 mmol/L — ABNORMAL LOW (ref 135–145)
Total Bilirubin: 0.8 mg/dL (ref 0.3–1.2)
Total Protein: 8 g/dL (ref 6.5–8.1)

## 2022-12-29 LAB — CBC WITH DIFFERENTIAL/PLATELET
Abs Immature Granulocytes: 0.06 10*3/uL (ref 0.00–0.07)
Basophils Absolute: 0 10*3/uL (ref 0.0–0.1)
Basophils Relative: 0 %
Eosinophils Absolute: 0 10*3/uL (ref 0.0–0.5)
Eosinophils Relative: 0 %
HCT: 51.3 % (ref 39.0–52.0)
Hemoglobin: 17.5 g/dL — ABNORMAL HIGH (ref 13.0–17.0)
Immature Granulocytes: 1 %
Lymphocytes Relative: 4 %
Lymphs Abs: 0.5 10*3/uL — ABNORMAL LOW (ref 0.7–4.0)
MCH: 30.8 pg (ref 26.0–34.0)
MCHC: 34.1 g/dL (ref 30.0–36.0)
MCV: 90.3 fL (ref 80.0–100.0)
Monocytes Absolute: 0.4 10*3/uL (ref 0.1–1.0)
Monocytes Relative: 3 %
Neutro Abs: 10.4 10*3/uL — ABNORMAL HIGH (ref 1.7–7.7)
Neutrophils Relative %: 92 %
Platelets: 236 10*3/uL (ref 150–400)
RBC: 5.68 MIL/uL (ref 4.22–5.81)
RDW: 14.6 % (ref 11.5–15.5)
WBC: 11.4 10*3/uL — ABNORMAL HIGH (ref 4.0–10.5)
nRBC: 0 % (ref 0.0–0.2)

## 2022-12-29 LAB — LIPASE, BLOOD: Lipase: 46 U/L (ref 11–51)

## 2022-12-29 MED ORDER — IOHEXOL 300 MG/ML  SOLN
100.0000 mL | Freq: Once | INTRAMUSCULAR | Status: AC | PRN
  Administered 2022-12-29: 100 mL via INTRAVENOUS

## 2022-12-29 MED ORDER — FENTANYL CITRATE PF 50 MCG/ML IJ SOSY
50.0000 ug | PREFILLED_SYRINGE | Freq: Once | INTRAMUSCULAR | Status: DC
  Filled 2022-12-29: qty 1

## 2022-12-29 MED ORDER — ACETAMINOPHEN 500 MG PO TABS
1000.0000 mg | ORAL_TABLET | Freq: Once | ORAL | Status: AC
  Administered 2022-12-30: 1000 mg via ORAL
  Filled 2022-12-29: qty 2

## 2022-12-29 MED ORDER — LACTATED RINGERS IV BOLUS: 1000.0000 mL | Freq: Once | INTRAVENOUS | Status: DC

## 2022-12-29 NOTE — ED Triage Notes (Signed)
Pt arrived from home via POV w c/o abd pain. Emesis x 1. Pain is noted to be bilateral flank and across complete lower abdomen. 7/10 on pain scale. Pt said that he just returned from [place] last week and was worried that maybe he caught something.

## 2022-12-29 NOTE — ED Provider Notes (Addendum)
[facility] Provider Note   CSN: [id number] Arrival date & time: 12/29/22  2126     History  Chief Complaint  Patient presents with   Abdominal Pain    Austin Tucker is a 67 y.o. male.  Patient with complaint of new onset of lower quadrant abdominal pain at 1400 today.  Associated with 1 episode of vomiting none since.  Did not vomit blood.  No diarrhea.  Had fever earlier but now resolved.  Patient returned from [place] last week.  Temp here 98.8 heart rate 105 blood pressure 109/77 oxygen sats is 96% on room air.  Past medical history significant for diabetes without complications.  Patient had left heart catheterization in 2021 patient is never used tobacco products.  Patient denies any dysuria.       Home Medications Prior to Admission medications   Medication Sig Start Date End Date Taking? Authorizing Provider  aspirin 81 MG chewable tablet Chew 1 tablet (81 mg total) by mouth daily. 12/02/20   Furth, Cadence H, PA-C  atorvastatin (LIPITOR) 40 MG tablet TAKE 1 TABLET BY MOUTH EVERY DAY 11/27/21   Furth, Cadence H, PA-C  losartan (COZAAR) 25 MG tablet Take 25 mg by mouth daily. 12/25/20 12/25/21  [provider]  metFORMIN (GLUCOPHAGE-XR) 500 MG 24 hr tablet Take 500 mg by mouth daily with breakfast.    [provider]  metoprolol tartrate (LOPRESSOR) 25 MG tablet Take 1 tablet (25 mg total) by mouth 2 (two) times daily. 12/01/20   Furth, Cadence H, PA-C  nitroGLYCERIN (NITROSTAT) 0.4 MG SL tablet Place 1 tablet (0.4 mg total) under the tongue every 5 (five) minutes as needed for chest pain. 12/01/20   Furth, Cadence H, PA-C  ticagrelor (BRILINTA) 90 MG TABS tablet Take 1 tablet (90 mg total) by mouth 2 (two) times daily. 12/01/20   Furth, Cadence H, PA-C      Allergies    Eggshell membrane (chicken) [egg shells] and Sesame seed (diagnostic)    Review of Systems   Review of Systems  Constitutional:  Positive  for fever. Negative for chills.  HENT:  Negative for rhinorrhea and sore throat.   Eyes:  Negative for visual disturbance.  Respiratory:  Negative for cough and shortness of breath.   Cardiovascular:  Negative for chest pain and leg swelling.  Gastrointestinal:  Positive for abdominal pain, nausea and vomiting. Negative for diarrhea.  Genitourinary:  Negative for dysuria.  Musculoskeletal:  Negative for back pain and neck pain.  Skin:  Negative for rash.  Neurological:  Negative for dizziness, light-headedness and headaches.  Hematological:  Does not bruise/bleed easily.  Psychiatric/Behavioral:  Negative for confusion.     Physical Exam Updated Vital Signs BP 109/77 (BP Location: Right Arm)   Pulse (!) 105   Temp 98.1 F (36.7 C) (Oral)   Resp 16   Ht 1.575 m ('5\' 2"'$ )   Wt 60.8 kg   SpO2 96%   BMI 24.51 kg/m  Physical Exam Vitals and nursing note reviewed.  Constitutional:      General: He is not in acute distress.    Appearance: He is well-developed. He is not ill-appearing.  HENT:     Head: Normocephalic and atraumatic.     Mouth/Throat:     Mouth: Mucous membranes are moist.  Eyes:     Conjunctiva/sclera: Conjunctivae normal.  Cardiovascular:     Rate and Rhythm: Normal rate and regular rhythm.     Heart  sounds: No murmur heard. Pulmonary:     Effort: Pulmonary effort is normal. No respiratory distress.     Breath sounds: Normal breath sounds.  Abdominal:     General: Abdomen is flat.     Palpations: Abdomen is soft.     Tenderness: There is no abdominal tenderness.     Hernia: No hernia is present.  Musculoskeletal:        General: No swelling.     Cervical back: Neck supple.  Skin:    General: Skin is warm and dry.     Capillary Refill: Capillary refill takes less than 2 seconds.  Neurological:     General: No focal deficit present.     Mental Status: He is alert and oriented to person, place, and time.  Psychiatric:        Mood and Affect: Mood normal.      ED Results / Procedures / Treatments   Labs (all labs ordered are listed, but only abnormal results are displayed) Labs Reviewed  LIPASE, BLOOD  COMPREHENSIVE METABOLIC PANEL  CBC  URINALYSIS, ROUTINE W REFLEX MICROSCOPIC    EKG None  Radiology No results found.  Procedures Procedures    Medications Ordered in ED Medications - No data to display  ED Course/ Medical Decision Making/ A&P                             Medical Decision Making Amount and/or Complexity of Data Reviewed Labs: ordered. Radiology: ordered.  Risk Prescription drug management.   Will get labs and CT scan of abdomen for the lower quadrant abdominal pain.  Patient nontoxic.  Vital signs reassuring.  CBC white count 11.4.  Hemoglobin 17.5.  Complete metabolic panel lipase still pending.  Pleat metabolic panel is back sodium is 130 chloride 97 CO2 20 glucose 161.  BUN and creatinine normal and renal function normal LFTs normal.  Anion gap actually normal at 13.  CBC as mentioned above.  CT scan abdomen pelvis pending.  Will have overnight physician follow-up on that.  And urinalysis is pending.   Final Clinical Impression(s) / ED Diagnoses Final diagnoses:  Lower abdominal pain    Rx / DC Orders ED Discharge Orders     None         Fredia Sorrow, MD 12/29/22 [id number]    Fredia Sorrow, MD 12/29/22 2300    Fredia Sorrow, MD 12/29/22 2300    Fredia Sorrow, MD 12/29/22 [phone]

## 2022-12-30 DIAGNOSIS — I10 Essential (primary) hypertension: Secondary | ICD-10-CM | POA: Diagnosis not present

## 2022-12-30 DIAGNOSIS — R509 Fever, unspecified: Secondary | ICD-10-CM | POA: Diagnosis not present

## 2022-12-30 DIAGNOSIS — Z299 Encounter for prophylactic measures, unspecified: Secondary | ICD-10-CM | POA: Diagnosis not present

## 2022-12-30 DIAGNOSIS — E7800 Pure hypercholesterolemia, unspecified: Secondary | ICD-10-CM | POA: Diagnosis not present

## 2022-12-30 DIAGNOSIS — E1165 Type 2 diabetes mellitus with hyperglycemia: Secondary | ICD-10-CM | POA: Diagnosis not present

## 2022-12-30 LAB — URINALYSIS, ROUTINE W REFLEX MICROSCOPIC
Bacteria, UA: NONE SEEN
Bilirubin Urine: NEGATIVE
Glucose, UA: >=500 mg/dL — AB
Ketones, ur: NEGATIVE mg/dL
Leukocytes,Ua: NEGATIVE
Nitrite: NEGATIVE
Protein, ur: NEGATIVE mg/dL
Specific Gravity, Urine: 1.042 — ABNORMAL HIGH (ref 1.005–1.030)
pH: 6 (ref 5.0–8.0)

## 2022-12-30 NOTE — ED Notes (Signed)
Pt refusing IV pain med and fluids, pt states that pain is not that bad. He is hungry and wants to go home  Dr. Dayna Barker aware

## 2022-12-30 NOTE — ED Provider Notes (Signed)
12:45 AM Assumed care from Dr. Rogene Houston, please see their note for full history, physical and decision making until this point. In brief this is a 67 y.o. year old male who presented to the ED tonight with Abdominal Pain     Nonspecific lower abdominal pain with leukocytosis, emesis earlier. Pending UA and CT for dispo.  CT reassuring. Discussed with patient. Will give pain meds, fluids and await urine.   Informed by nursing that he left AMA. Refused pain meds and fluids.   UA did come back with some blood. May have passed a kidney stone earlier, or possibly had one not seen on CT. Either way, patient has left AMA without these results.   Labs, studies and imaging reviewed by myself and considered in medical decision making if ordered. Imaging interpreted by radiology.  Labs Reviewed  COMPREHENSIVE METABOLIC PANEL - Abnormal; Notable for the following components:      Result Value   Sodium 130 (*)    Chloride 97 (*)    CO2 20 (*)    Glucose, Bld 161 (*)    All other components within normal limits  URINALYSIS, ROUTINE W REFLEX MICROSCOPIC - Abnormal; Notable for the following components:   Specific Gravity, Urine 1.042 (*)    Glucose, UA >=500 (*)    Hgb urine dipstick MODERATE (*)    All other components within normal limits  CBC WITH DIFFERENTIAL/PLATELET - Abnormal; Notable for the following components:   WBC 11.4 (*)    Hemoglobin 17.5 (*)    Neutro Abs 10.4 (*)    Lymphs Abs 0.5 (*)    All other components within normal limits  LIPASE, BLOOD    CT Abdomen Pelvis W Contrast  Final Result      No follow-ups on file.    Pasqualino Witherspoon, Corene Cornea, MD 12/30/22 [phone]

## 2022-12-30 NOTE — ED Notes (Signed)
Pt states understanding that since he is leaving before the provider is able to discharge him that his symptoms may come back or get worse   IV removed pt able to get dressed and ambulate out of department independently. NAD

## 2023-01-08 DIAGNOSIS — I10 Essential (primary) hypertension: Secondary | ICD-10-CM | POA: Diagnosis not present

## 2023-01-08 DIAGNOSIS — D849 Immunodeficiency, unspecified: Secondary | ICD-10-CM | POA: Diagnosis not present

## 2023-01-08 DIAGNOSIS — E1165 Type 2 diabetes mellitus with hyperglycemia: Secondary | ICD-10-CM | POA: Diagnosis not present

## 2023-01-08 DIAGNOSIS — Z299 Encounter for prophylactic measures, unspecified: Secondary | ICD-10-CM | POA: Diagnosis not present

## 2023-01-20 DIAGNOSIS — I10 Essential (primary) hypertension: Secondary | ICD-10-CM | POA: Diagnosis not present

## 2023-01-20 DIAGNOSIS — Z6824 Body mass index (BMI) 24.0-24.9, adult: Secondary | ICD-10-CM | POA: Diagnosis not present

## 2023-01-20 DIAGNOSIS — Z87891 Personal history of nicotine dependence: Secondary | ICD-10-CM | POA: Diagnosis not present

## 2023-01-20 DIAGNOSIS — E7800 Pure hypercholesterolemia, unspecified: Secondary | ICD-10-CM | POA: Diagnosis not present

## 2023-01-20 DIAGNOSIS — R079 Chest pain, unspecified: Secondary | ICD-10-CM | POA: Diagnosis not present

## 2023-01-20 DIAGNOSIS — Z299 Encounter for prophylactic measures, unspecified: Secondary | ICD-10-CM | POA: Diagnosis not present

## 2023-01-21 DIAGNOSIS — Z955 Presence of coronary angioplasty implant and graft: Secondary | ICD-10-CM | POA: Diagnosis not present

## 2023-01-21 DIAGNOSIS — I2510 Atherosclerotic heart disease of native coronary artery without angina pectoris: Secondary | ICD-10-CM | POA: Diagnosis not present

## 2023-01-21 DIAGNOSIS — I2119 ST elevation (STEMI) myocardial infarction involving other coronary artery of inferior wall: Secondary | ICD-10-CM | POA: Diagnosis not present

## 2023-01-21 DIAGNOSIS — E1100 Type 2 diabetes mellitus with hyperosmolarity without nonketotic hyperglycemic-hyperosmolar coma (NKHHC): Secondary | ICD-10-CM | POA: Diagnosis not present

## 2023-01-21 DIAGNOSIS — I2121 ST elevation (STEMI) myocardial infarction involving left circumflex coronary artery: Secondary | ICD-10-CM | POA: Diagnosis not present

## 2023-01-21 DIAGNOSIS — R0602 Shortness of breath: Secondary | ICD-10-CM | POA: Diagnosis not present

## 2023-01-21 DIAGNOSIS — E7800 Pure hypercholesterolemia, unspecified: Secondary | ICD-10-CM | POA: Diagnosis not present

## 2023-01-27 DIAGNOSIS — E1165 Type 2 diabetes mellitus with hyperglycemia: Secondary | ICD-10-CM | POA: Diagnosis not present

## 2023-02-17 DIAGNOSIS — Z6824 Body mass index (BMI) 24.0-24.9, adult: Secondary | ICD-10-CM | POA: Diagnosis not present

## 2023-02-17 DIAGNOSIS — E7800 Pure hypercholesterolemia, unspecified: Secondary | ICD-10-CM | POA: Diagnosis not present

## 2023-02-17 DIAGNOSIS — Z87891 Personal history of nicotine dependence: Secondary | ICD-10-CM | POA: Diagnosis not present

## 2023-02-17 DIAGNOSIS — I10 Essential (primary) hypertension: Secondary | ICD-10-CM | POA: Diagnosis not present

## 2023-02-17 DIAGNOSIS — Z299 Encounter for prophylactic measures, unspecified: Secondary | ICD-10-CM | POA: Diagnosis not present

## 2023-02-18 DIAGNOSIS — H2513 Age-related nuclear cataract, bilateral: Secondary | ICD-10-CM | POA: Diagnosis not present

## 2023-02-18 DIAGNOSIS — E119 Type 2 diabetes mellitus without complications: Secondary | ICD-10-CM | POA: Diagnosis not present

## 2023-02-25 DIAGNOSIS — E1165 Type 2 diabetes mellitus with hyperglycemia: Secondary | ICD-10-CM | POA: Diagnosis not present

## 2023-02-27 DIAGNOSIS — R0602 Shortness of breath: Secondary | ICD-10-CM | POA: Diagnosis not present

## 2023-03-21 DIAGNOSIS — F17210 Nicotine dependence, cigarettes, uncomplicated: Secondary | ICD-10-CM | POA: Diagnosis not present

## 2023-03-28 DIAGNOSIS — E1165 Type 2 diabetes mellitus with hyperglycemia: Secondary | ICD-10-CM | POA: Diagnosis not present

## 2023-04-17 DIAGNOSIS — I10 Essential (primary) hypertension: Secondary | ICD-10-CM | POA: Diagnosis not present

## 2023-04-17 DIAGNOSIS — Z299 Encounter for prophylactic measures, unspecified: Secondary | ICD-10-CM | POA: Diagnosis not present

## 2023-04-17 DIAGNOSIS — E1165 Type 2 diabetes mellitus with hyperglycemia: Secondary | ICD-10-CM | POA: Diagnosis not present

## 2023-04-17 DIAGNOSIS — I2510 Atherosclerotic heart disease of native coronary artery without angina pectoris: Secondary | ICD-10-CM | POA: Diagnosis not present

## 2023-04-27 DIAGNOSIS — E1165 Type 2 diabetes mellitus with hyperglycemia: Secondary | ICD-10-CM | POA: Diagnosis not present

## 2023-05-28 DIAGNOSIS — E1165 Type 2 diabetes mellitus with hyperglycemia: Secondary | ICD-10-CM | POA: Diagnosis not present

## 2023-05-29 DIAGNOSIS — Z299 Encounter for prophylactic measures, unspecified: Secondary | ICD-10-CM | POA: Diagnosis not present

## 2023-05-29 DIAGNOSIS — E1165 Type 2 diabetes mellitus with hyperglycemia: Secondary | ICD-10-CM | POA: Diagnosis not present

## 2023-05-29 DIAGNOSIS — I10 Essential (primary) hypertension: Secondary | ICD-10-CM | POA: Diagnosis not present

## 2023-05-29 DIAGNOSIS — L409 Psoriasis, unspecified: Secondary | ICD-10-CM | POA: Diagnosis not present

## 2023-06-27 DIAGNOSIS — E1165 Type 2 diabetes mellitus with hyperglycemia: Secondary | ICD-10-CM | POA: Diagnosis not present

## 2023-07-21 DIAGNOSIS — I5022 Chronic systolic (congestive) heart failure: Secondary | ICD-10-CM | POA: Diagnosis not present

## 2023-07-21 DIAGNOSIS — I2510 Atherosclerotic heart disease of native coronary artery without angina pectoris: Secondary | ICD-10-CM | POA: Diagnosis not present

## 2023-07-21 DIAGNOSIS — I2119 ST elevation (STEMI) myocardial infarction involving other coronary artery of inferior wall: Secondary | ICD-10-CM | POA: Diagnosis not present

## 2023-07-21 DIAGNOSIS — E1100 Type 2 diabetes mellitus with hyperosmolarity without nonketotic hyperglycemic-hyperosmolar coma (NKHHC): Secondary | ICD-10-CM | POA: Diagnosis not present

## 2023-07-21 DIAGNOSIS — I2121 ST elevation (STEMI) myocardial infarction involving left circumflex coronary artery: Secondary | ICD-10-CM | POA: Diagnosis not present

## 2023-07-21 DIAGNOSIS — Z955 Presence of coronary angioplasty implant and graft: Secondary | ICD-10-CM | POA: Diagnosis not present

## 2023-07-21 DIAGNOSIS — E7800 Pure hypercholesterolemia, unspecified: Secondary | ICD-10-CM | POA: Diagnosis not present

## 2023-07-22 DIAGNOSIS — Z299 Encounter for prophylactic measures, unspecified: Secondary | ICD-10-CM | POA: Diagnosis not present

## 2023-07-22 DIAGNOSIS — Z7189 Other specified counseling: Secondary | ICD-10-CM | POA: Diagnosis not present

## 2023-07-22 DIAGNOSIS — I10 Essential (primary) hypertension: Secondary | ICD-10-CM | POA: Diagnosis not present

## 2023-07-22 DIAGNOSIS — Z0000 Encounter for general adult medical examination without abnormal findings: Secondary | ICD-10-CM | POA: Diagnosis not present

## 2023-07-22 DIAGNOSIS — I2510 Atherosclerotic heart disease of native coronary artery without angina pectoris: Secondary | ICD-10-CM | POA: Diagnosis not present

## 2023-07-22 DIAGNOSIS — E1165 Type 2 diabetes mellitus with hyperglycemia: Secondary | ICD-10-CM | POA: Diagnosis not present

## 2023-07-28 DIAGNOSIS — E1165 Type 2 diabetes mellitus with hyperglycemia: Secondary | ICD-10-CM | POA: Diagnosis not present

## 2023-08-01 DIAGNOSIS — Z6824 Body mass index (BMI) 24.0-24.9, adult: Secondary | ICD-10-CM | POA: Diagnosis not present

## 2023-08-01 DIAGNOSIS — E1165 Type 2 diabetes mellitus with hyperglycemia: Secondary | ICD-10-CM | POA: Diagnosis not present

## 2023-08-22 DIAGNOSIS — Z2821 Immunization not carried out because of patient refusal: Secondary | ICD-10-CM | POA: Diagnosis not present

## 2023-08-22 DIAGNOSIS — Z299 Encounter for prophylactic measures, unspecified: Secondary | ICD-10-CM | POA: Diagnosis not present

## 2023-08-22 DIAGNOSIS — I5022 Chronic systolic (congestive) heart failure: Secondary | ICD-10-CM | POA: Diagnosis not present

## 2023-08-22 DIAGNOSIS — E1165 Type 2 diabetes mellitus with hyperglycemia: Secondary | ICD-10-CM | POA: Diagnosis not present

## 2023-08-22 DIAGNOSIS — I10 Essential (primary) hypertension: Secondary | ICD-10-CM | POA: Diagnosis not present

## 2023-08-28 DIAGNOSIS — E1165 Type 2 diabetes mellitus with hyperglycemia: Secondary | ICD-10-CM | POA: Diagnosis not present

## 2023-09-23 DIAGNOSIS — Z299 Encounter for prophylactic measures, unspecified: Secondary | ICD-10-CM | POA: Diagnosis not present

## 2023-09-23 DIAGNOSIS — M25551 Pain in right hip: Secondary | ICD-10-CM | POA: Diagnosis not present

## 2023-09-23 DIAGNOSIS — M5431 Sciatica, right side: Secondary | ICD-10-CM | POA: Diagnosis not present

## 2023-09-23 DIAGNOSIS — I10 Essential (primary) hypertension: Secondary | ICD-10-CM | POA: Diagnosis not present

## 2023-09-27 DIAGNOSIS — E1165 Type 2 diabetes mellitus with hyperglycemia: Secondary | ICD-10-CM | POA: Diagnosis not present

## 2023-10-07 DIAGNOSIS — I10 Essential (primary) hypertension: Secondary | ICD-10-CM | POA: Diagnosis not present

## 2023-10-07 DIAGNOSIS — Z299 Encounter for prophylactic measures, unspecified: Secondary | ICD-10-CM | POA: Diagnosis not present

## 2023-10-07 DIAGNOSIS — E1169 Type 2 diabetes mellitus with other specified complication: Secondary | ICD-10-CM | POA: Diagnosis not present

## 2023-10-07 DIAGNOSIS — I5022 Chronic systolic (congestive) heart failure: Secondary | ICD-10-CM | POA: Diagnosis not present

## 2023-10-07 DIAGNOSIS — M549 Dorsalgia, unspecified: Secondary | ICD-10-CM | POA: Diagnosis not present

## 2023-10-07 DIAGNOSIS — R52 Pain, unspecified: Secondary | ICD-10-CM | POA: Diagnosis not present

## 2023-10-20 DIAGNOSIS — Z955 Presence of coronary angioplasty implant and graft: Secondary | ICD-10-CM | POA: Diagnosis not present

## 2023-10-20 DIAGNOSIS — R0789 Other chest pain: Secondary | ICD-10-CM | POA: Diagnosis not present

## 2023-10-20 DIAGNOSIS — M9973 Connective tissue and disc stenosis of intervertebral foramina of lumbar region: Secondary | ICD-10-CM | POA: Diagnosis not present

## 2023-10-20 DIAGNOSIS — M1611 Unilateral primary osteoarthritis, right hip: Secondary | ICD-10-CM | POA: Diagnosis not present

## 2023-10-20 DIAGNOSIS — E7800 Pure hypercholesterolemia, unspecified: Secondary | ICD-10-CM | POA: Diagnosis not present

## 2023-10-20 DIAGNOSIS — M79604 Pain in right leg: Secondary | ICD-10-CM | POA: Diagnosis not present

## 2023-10-20 DIAGNOSIS — E1100 Type 2 diabetes mellitus with hyperosmolarity without nonketotic hyperglycemic-hyperosmolar coma (NKHHC): Secondary | ICD-10-CM | POA: Diagnosis not present

## 2023-10-20 DIAGNOSIS — I2510 Atherosclerotic heart disease of native coronary artery without angina pectoris: Secondary | ICD-10-CM | POA: Diagnosis not present

## 2023-10-20 DIAGNOSIS — Z87891 Personal history of nicotine dependence: Secondary | ICD-10-CM | POA: Diagnosis not present

## 2023-10-28 DIAGNOSIS — E1165 Type 2 diabetes mellitus with hyperglycemia: Secondary | ICD-10-CM | POA: Diagnosis not present

## 2023-10-28 DIAGNOSIS — I2510 Atherosclerotic heart disease of native coronary artery without angina pectoris: Secondary | ICD-10-CM | POA: Diagnosis not present

## 2023-10-28 DIAGNOSIS — R0789 Other chest pain: Secondary | ICD-10-CM | POA: Diagnosis not present

## 2023-10-31 DIAGNOSIS — E1169 Type 2 diabetes mellitus with other specified complication: Secondary | ICD-10-CM | POA: Diagnosis not present

## 2023-10-31 DIAGNOSIS — I10 Essential (primary) hypertension: Secondary | ICD-10-CM | POA: Diagnosis not present

## 2023-10-31 DIAGNOSIS — Z299 Encounter for prophylactic measures, unspecified: Secondary | ICD-10-CM | POA: Diagnosis not present

## 2023-11-27 DIAGNOSIS — E1165 Type 2 diabetes mellitus with hyperglycemia: Secondary | ICD-10-CM | POA: Diagnosis not present

## 2023-12-01 DIAGNOSIS — Z299 Encounter for prophylactic measures, unspecified: Secondary | ICD-10-CM | POA: Diagnosis not present

## 2023-12-01 DIAGNOSIS — J069 Acute upper respiratory infection, unspecified: Secondary | ICD-10-CM | POA: Diagnosis not present

## 2023-12-01 DIAGNOSIS — R0981 Nasal congestion: Secondary | ICD-10-CM | POA: Diagnosis not present

## 2023-12-02 DIAGNOSIS — I2119 ST elevation (STEMI) myocardial infarction involving other coronary artery of inferior wall: Secondary | ICD-10-CM | POA: Diagnosis not present

## 2023-12-02 DIAGNOSIS — E7800 Pure hypercholesterolemia, unspecified: Secondary | ICD-10-CM | POA: Diagnosis not present

## 2023-12-02 DIAGNOSIS — I5022 Chronic systolic (congestive) heart failure: Secondary | ICD-10-CM | POA: Diagnosis not present

## 2023-12-02 DIAGNOSIS — E1100 Type 2 diabetes mellitus with hyperosmolarity without nonketotic hyperglycemic-hyperosmolar coma (NKHHC): Secondary | ICD-10-CM | POA: Diagnosis not present

## 2023-12-02 DIAGNOSIS — I2510 Atherosclerotic heart disease of native coronary artery without angina pectoris: Secondary | ICD-10-CM | POA: Diagnosis not present

## 2023-12-02 DIAGNOSIS — Z955 Presence of coronary angioplasty implant and graft: Secondary | ICD-10-CM | POA: Diagnosis not present

## 2023-12-02 DIAGNOSIS — I2121 ST elevation (STEMI) myocardial infarction involving left circumflex coronary artery: Secondary | ICD-10-CM | POA: Diagnosis not present

## 2023-12-02 DIAGNOSIS — R002 Palpitations: Secondary | ICD-10-CM | POA: Diagnosis not present

## 2023-12-12 DIAGNOSIS — Z299 Encounter for prophylactic measures, unspecified: Secondary | ICD-10-CM | POA: Diagnosis not present

## 2023-12-12 DIAGNOSIS — E1169 Type 2 diabetes mellitus with other specified complication: Secondary | ICD-10-CM | POA: Diagnosis not present

## 2023-12-12 DIAGNOSIS — I5022 Chronic systolic (congestive) heart failure: Secondary | ICD-10-CM | POA: Diagnosis not present

## 2023-12-12 DIAGNOSIS — I10 Essential (primary) hypertension: Secondary | ICD-10-CM | POA: Diagnosis not present

## 2023-12-12 DIAGNOSIS — D849 Immunodeficiency, unspecified: Secondary | ICD-10-CM | POA: Diagnosis not present

## 2023-12-17 DIAGNOSIS — I5022 Chronic systolic (congestive) heart failure: Secondary | ICD-10-CM | POA: Diagnosis not present

## 2023-12-17 DIAGNOSIS — L409 Psoriasis, unspecified: Secondary | ICD-10-CM | POA: Diagnosis not present

## 2023-12-17 DIAGNOSIS — I10 Essential (primary) hypertension: Secondary | ICD-10-CM | POA: Diagnosis not present

## 2023-12-17 DIAGNOSIS — Z299 Encounter for prophylactic measures, unspecified: Secondary | ICD-10-CM | POA: Diagnosis not present

## 2023-12-24 DIAGNOSIS — Z79899 Other long term (current) drug therapy: Secondary | ICD-10-CM | POA: Diagnosis not present

## 2023-12-24 DIAGNOSIS — L810 Postinflammatory hyperpigmentation: Secondary | ICD-10-CM | POA: Diagnosis not present

## 2023-12-24 DIAGNOSIS — L400 Psoriasis vulgaris: Secondary | ICD-10-CM | POA: Diagnosis not present

## 2023-12-24 DIAGNOSIS — L728 Other follicular cysts of the skin and subcutaneous tissue: Secondary | ICD-10-CM | POA: Diagnosis not present

## 2023-12-28 DIAGNOSIS — E1165 Type 2 diabetes mellitus with hyperglycemia: Secondary | ICD-10-CM | POA: Diagnosis not present

## 2024-01-13 DIAGNOSIS — E119 Type 2 diabetes mellitus without complications: Secondary | ICD-10-CM | POA: Diagnosis not present

## 2024-01-13 DIAGNOSIS — H2513 Age-related nuclear cataract, bilateral: Secondary | ICD-10-CM | POA: Diagnosis not present

## 2024-01-19 DIAGNOSIS — Z720 Tobacco use: Secondary | ICD-10-CM | POA: Diagnosis not present

## 2024-01-19 DIAGNOSIS — I2121 ST elevation (STEMI) myocardial infarction involving left circumflex coronary artery: Secondary | ICD-10-CM | POA: Diagnosis not present

## 2024-01-19 DIAGNOSIS — Z955 Presence of coronary angioplasty implant and graft: Secondary | ICD-10-CM | POA: Diagnosis not present

## 2024-01-19 DIAGNOSIS — I2510 Atherosclerotic heart disease of native coronary artery without angina pectoris: Secondary | ICD-10-CM | POA: Diagnosis not present

## 2024-01-19 DIAGNOSIS — I2119 ST elevation (STEMI) myocardial infarction involving other coronary artery of inferior wall: Secondary | ICD-10-CM | POA: Diagnosis not present

## 2024-01-19 DIAGNOSIS — E1100 Type 2 diabetes mellitus with hyperosmolarity without nonketotic hyperglycemic-hyperosmolar coma (NKHHC): Secondary | ICD-10-CM | POA: Diagnosis not present

## 2024-01-19 DIAGNOSIS — I5022 Chronic systolic (congestive) heart failure: Secondary | ICD-10-CM | POA: Diagnosis not present

## 2024-01-19 DIAGNOSIS — E7800 Pure hypercholesterolemia, unspecified: Secondary | ICD-10-CM | POA: Diagnosis not present

## 2024-01-19 DIAGNOSIS — Z23 Encounter for immunization: Secondary | ICD-10-CM | POA: Diagnosis not present

## 2024-01-28 DIAGNOSIS — E1165 Type 2 diabetes mellitus with hyperglycemia: Secondary | ICD-10-CM | POA: Diagnosis not present

## 2024-02-10 DIAGNOSIS — I10 Essential (primary) hypertension: Secondary | ICD-10-CM | POA: Diagnosis not present

## 2024-02-10 DIAGNOSIS — E1165 Type 2 diabetes mellitus with hyperglycemia: Secondary | ICD-10-CM | POA: Diagnosis not present

## 2024-02-10 DIAGNOSIS — Z299 Encounter for prophylactic measures, unspecified: Secondary | ICD-10-CM | POA: Diagnosis not present

## 2024-02-10 DIAGNOSIS — L409 Psoriasis, unspecified: Secondary | ICD-10-CM | POA: Diagnosis not present

## 2024-02-25 DIAGNOSIS — E1165 Type 2 diabetes mellitus with hyperglycemia: Secondary | ICD-10-CM | POA: Diagnosis not present

## 2024-03-27 DIAGNOSIS — E1165 Type 2 diabetes mellitus with hyperglycemia: Secondary | ICD-10-CM | POA: Diagnosis not present

## 2024-04-26 DIAGNOSIS — E1165 Type 2 diabetes mellitus with hyperglycemia: Secondary | ICD-10-CM | POA: Diagnosis not present

## 2024-05-18 DIAGNOSIS — I5022 Chronic systolic (congestive) heart failure: Secondary | ICD-10-CM | POA: Diagnosis not present

## 2024-05-18 DIAGNOSIS — E1165 Type 2 diabetes mellitus with hyperglycemia: Secondary | ICD-10-CM | POA: Diagnosis not present

## 2024-05-18 DIAGNOSIS — I10 Essential (primary) hypertension: Secondary | ICD-10-CM | POA: Diagnosis not present

## 2024-05-18 DIAGNOSIS — Z299 Encounter for prophylactic measures, unspecified: Secondary | ICD-10-CM | POA: Diagnosis not present

## 2024-05-27 DIAGNOSIS — E1165 Type 2 diabetes mellitus with hyperglycemia: Secondary | ICD-10-CM | POA: Diagnosis not present

## 2024-06-26 DIAGNOSIS — E1165 Type 2 diabetes mellitus with hyperglycemia: Secondary | ICD-10-CM | POA: Diagnosis not present

## 2024-07-23 DIAGNOSIS — E7800 Pure hypercholesterolemia, unspecified: Secondary | ICD-10-CM | POA: Diagnosis not present

## 2024-07-23 DIAGNOSIS — Z0000 Encounter for general adult medical examination without abnormal findings: Secondary | ICD-10-CM | POA: Diagnosis not present

## 2024-07-23 DIAGNOSIS — Z299 Encounter for prophylactic measures, unspecified: Secondary | ICD-10-CM | POA: Diagnosis not present

## 2024-07-23 DIAGNOSIS — I10 Essential (primary) hypertension: Secondary | ICD-10-CM | POA: Diagnosis not present

## 2024-07-23 DIAGNOSIS — Z7189 Other specified counseling: Secondary | ICD-10-CM | POA: Diagnosis not present

## 2024-07-23 DIAGNOSIS — Z79899 Other long term (current) drug therapy: Secondary | ICD-10-CM | POA: Diagnosis not present

## 2024-07-23 DIAGNOSIS — R5383 Other fatigue: Secondary | ICD-10-CM | POA: Diagnosis not present

## 2024-07-27 DIAGNOSIS — E1165 Type 2 diabetes mellitus with hyperglycemia: Secondary | ICD-10-CM | POA: Diagnosis not present

## 2024-08-27 DIAGNOSIS — E1165 Type 2 diabetes mellitus with hyperglycemia: Secondary | ICD-10-CM | POA: Diagnosis not present

## 2024-09-16 DIAGNOSIS — E7800 Pure hypercholesterolemia, unspecified: Secondary | ICD-10-CM | POA: Diagnosis not present

## 2024-09-16 DIAGNOSIS — I2121 ST elevation (STEMI) myocardial infarction involving left circumflex coronary artery: Secondary | ICD-10-CM | POA: Diagnosis not present

## 2024-09-16 DIAGNOSIS — Z955 Presence of coronary angioplasty implant and graft: Secondary | ICD-10-CM | POA: Diagnosis not present

## 2024-09-16 DIAGNOSIS — I2510 Atherosclerotic heart disease of native coronary artery without angina pectoris: Secondary | ICD-10-CM | POA: Diagnosis not present

## 2024-09-16 DIAGNOSIS — E1100 Type 2 diabetes mellitus with hyperosmolarity without nonketotic hyperglycemic-hyperosmolar coma (NKHHC): Secondary | ICD-10-CM | POA: Diagnosis not present

## 2024-09-24 DIAGNOSIS — I10 Essential (primary) hypertension: Secondary | ICD-10-CM | POA: Diagnosis not present

## 2024-09-24 DIAGNOSIS — Z299 Encounter for prophylactic measures, unspecified: Secondary | ICD-10-CM | POA: Diagnosis not present

## 2024-09-24 DIAGNOSIS — Z0000 Encounter for general adult medical examination without abnormal findings: Secondary | ICD-10-CM | POA: Diagnosis not present

## 2024-09-24 DIAGNOSIS — E119 Type 2 diabetes mellitus without complications: Secondary | ICD-10-CM | POA: Diagnosis not present

## 2024-09-24 DIAGNOSIS — I5022 Chronic systolic (congestive) heart failure: Secondary | ICD-10-CM | POA: Diagnosis not present

## 2024-09-26 DIAGNOSIS — E1165 Type 2 diabetes mellitus with hyperglycemia: Secondary | ICD-10-CM | POA: Diagnosis not present

## 2024-11-02 NOTE — Progress Notes (Signed)
 Austin Tucker                                          MRN: [id number]   11/02/2024   The [facility] Quality Team Specialist reviewed this patient medical record for the purposes of chart review for care gap closure. The following were reviewed: chart review for care gap closure-kidney health evaluation for diabetes:eGFR  and uACR.    [facility] Quality Team

## 2024-11-03 NOTE — Progress Notes (Signed)
 Austin Tucker                                          MRN: [id number]   11/03/2024   The [facility] Quality Team Specialist reviewed this patient medical record for the purposes of chart review for care gap closure. The following were reviewed: chart review for care gap closure-kidney health evaluation for diabetes:eGFR  and uACR.    [facility] Quality Team
